# Patient Record
Sex: Male | Born: 1945
Health system: Southern US, Community
[De-identification: ages and names within clinical notes are randomized; demographics above are authoritative.]

## PROBLEM LIST (undated history)

## (undated) DIAGNOSIS — R350 Frequency of micturition: Secondary | ICD-10-CM

## (undated) DIAGNOSIS — I4510 Unspecified right bundle-branch block: Secondary | ICD-10-CM

## (undated) DIAGNOSIS — Z9189 Other specified personal risk factors, not elsewhere classified: Secondary | ICD-10-CM

## (undated) DIAGNOSIS — N35919 Unspecified urethral stricture, male, unspecified site: Secondary | ICD-10-CM

## (undated) DIAGNOSIS — I10 Essential (primary) hypertension: Secondary | ICD-10-CM

## (undated) DIAGNOSIS — E785 Hyperlipidemia, unspecified: Secondary | ICD-10-CM

## (undated) DIAGNOSIS — Z8546 Personal history of malignant neoplasm of prostate: Secondary | ICD-10-CM

## (undated) DIAGNOSIS — R3915 Urgency of urination: Secondary | ICD-10-CM

## (undated) DIAGNOSIS — K219 Gastro-esophageal reflux disease without esophagitis: Secondary | ICD-10-CM

## (undated) HISTORY — PX: RETINAL DETACHMENT SURGERY: SHX105

## (undated) HISTORY — DX: Essential (primary) hypertension: I10

## (undated) HISTORY — DX: Hyperlipidemia, unspecified: E78.5

## (undated) HISTORY — PX: COLONOSCOPY: SHX174

---

## 2000-02-11 ENCOUNTER — Ambulatory Visit (HOSPITAL_COMMUNITY): Admission: RE | Admit: 2000-02-11 | Discharge: 2000-02-11 | Payer: Self-pay | Admitting: Gastroenterology

## 2000-08-23 ENCOUNTER — Encounter: Payer: Self-pay | Admitting: Family Medicine

## 2000-08-23 ENCOUNTER — Encounter: Admission: RE | Admit: 2000-08-23 | Discharge: 2000-08-23 | Payer: Self-pay | Admitting: Family Medicine

## 2004-02-22 ENCOUNTER — Ambulatory Visit (HOSPITAL_COMMUNITY): Admission: RE | Admit: 2004-02-22 | Discharge: 2004-02-23 | Payer: Self-pay | Admitting: Ophthalmology

## 2004-02-22 HISTORY — PX: PARS PLANA VITRECTOMY W/ ENDOPHOTOCOAGULATION: SHX2168

## 2004-03-09 HISTORY — PX: TOTAL SHOULDER REPLACEMENT: SUR1217

## 2005-01-12 ENCOUNTER — Encounter: Admission: RE | Admit: 2005-01-12 | Discharge: 2005-01-12 | Payer: Self-pay | Admitting: Family Medicine

## 2008-04-20 ENCOUNTER — Encounter: Admission: RE | Admit: 2008-04-20 | Discharge: 2008-04-20 | Payer: Self-pay | Admitting: Family Medicine

## 2008-06-06 ENCOUNTER — Encounter: Payer: Self-pay | Admitting: Emergency Medicine

## 2008-07-16 ENCOUNTER — Encounter: Payer: Self-pay | Admitting: Emergency Medicine

## 2008-09-03 DIAGNOSIS — I10 Essential (primary) hypertension: Secondary | ICD-10-CM | POA: Insufficient documentation

## 2008-09-03 DIAGNOSIS — K219 Gastro-esophageal reflux disease without esophagitis: Secondary | ICD-10-CM | POA: Insufficient documentation

## 2008-09-03 DIAGNOSIS — E7800 Pure hypercholesterolemia, unspecified: Secondary | ICD-10-CM | POA: Insufficient documentation

## 2008-09-04 ENCOUNTER — Ambulatory Visit: Payer: Self-pay | Admitting: Emergency Medicine

## 2008-09-04 DIAGNOSIS — R05 Cough: Secondary | ICD-10-CM

## 2008-09-04 DIAGNOSIS — R059 Cough, unspecified: Secondary | ICD-10-CM | POA: Insufficient documentation

## 2008-10-16 ENCOUNTER — Ambulatory Visit: Payer: Self-pay | Admitting: Emergency Medicine

## 2009-01-15 ENCOUNTER — Encounter: Payer: Self-pay | Admitting: Emergency Medicine

## 2010-07-25 NOTE — Op Note (Signed)
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   OPERATION:  Pars plana vitrectomy using vitreous infusion suction cutter,  endolaser photocoagulation, indirect laser photocoagulation.   SURGEON:  Guadelupe Sabin, M.D.   ASSISTANT:  Nurse   ANESTHESIA:  Local 4% Xylocaine, 0.75% Marcaine, retrobulbar block, topical  Tetracaine, and topical Xylocaine.  Anesthesia standby required in this  patient.   OPERATIVE PROCEDURE:  After the patient was prepped and draped, the lid  speculum was inserted in the right eye.  The eye was turned downward and a  superior rectus traction suture placed.  A peritomy was performed adjacent  to the limbus from the 2 to 4 o'clock position superiorly.  The  subconjunctival tissue was cleaned and three sclerotomy sites prepared with  the MVR blade 3.5 mm from the limbus using the 20 gauge MVR knife.  The 4 mm  vitreous infusion terminal was secured in place with a 5-0 mattress Dacron  suture at the 4 o'clock position.  The fiberoptic light pipe was inserted at  the 2 o'clock position and a handpiece vitreous infusion suction cutter at  the 10 o'clock position.  Slow vitreous infusion suction cutting were begun  from an anterior to posterior direction toward the retina.  Dense vitreous  hemorrhage was incurred, new and old.  As the vitreous cleared, the  underlying retina could  be seen with a pool of blood on the retinal surface.  Using the silicone tip aspirator, this blood was slowly aspirated.  Indirect  ophthalmoscopy was then performed revealing the old retinal tear site at the  11 o'clock position close to the ora serrata.  The indirect laser  photocoagulator was then assembled and applications applied surrounding the  retinal tear.  The vitreous instruments were reinserted and the vitreous  cleared once again.  An abnormal vascular area was noted along the superior  temporal retinal blood vessel with possible AV communication consistent with  Eales disease.  Endolaser photocoagulation was used to surround this area of  vascular abnormality and possible retinal neovascularization.  A total  number of 423 applications were made with the indirect and with the  endolaser.  The vitreous instruments were then withdrawn after the fundus  had been inspected revealing no peripheral retinal tears or detachment.  The  sclerotomy sites were closed with 7-0 interrupted Vicryl sutures and the  conjunctiva brought forward and closed with a running 7-0 Vicryl suture.  Depo-Garamycin and Dexamethasone were injected in the sub-tenon space  inferiorly.  Atropine and Maxitrol  ointment were instilled in the  conjunctival cul-de-sac.  A  light patch and protective shield were applied.  Duration of the procedure 1  1/2 hours.  The patient tolerated the procedure well in general and left the  operating room for the recovery room and subsequently to the 23 hour  observation unit.       HNJ/MEDQ  D:  02/22/2004  T:  02/22/2004  Job:  [id number]

## 2010-07-25 NOTE — H&P (Signed)
NAME:  Frederick Miller, Frederick Miller               ACCOUNT NO.:  [id number]   MEDICAL RECORD NO.:  [id number]          PATIENT TYPE:  OIB   LOCATION:  2550                         FACILITY:  [facility]   PHYSICIAN:  Guadelupe Sabin, M.D.DATE OF BIRTH:  08-12-1945   DATE OF ADMISSION:  02/22/2004  DATE OF DISCHARGE:                                HISTORY & PHYSICAL   HISTORY OF PRESENT ILLNESS:  This was a planned outpatient surgical  admission of this 65 year old, white male admitted for posterior vitrectomy  surgery for a chronic and recurrent vitreous hemorrhage in right eye.  This  patient noted the sudden onset of blurred vision in his right eye.  He was  seen by Dr. Garlan Fillers who noted vitreous hemorrhage and possible retinal  tear of the right eye.  The patient was referred to my office and seen first  on September 24, 2003, after a 3 month duration.  It was felt that there was a  suspicious retinal tear area at the 11 o'clock position and this was  surrounded with laser photocoagulation on September 24, 2003.  The patient did  well following this initially with clearing of the vitreous spontaneously  and good laser reaction around the retinal tear.  Secondary vitreous  hemorrhage, however, has occurred on at least two occasions and his vision  now has been reduced by the vitreous hemorrhage to 20/200 in right eye,  compared to the left eye 20/25 without glasses.  The patient was given oral  discussion and printed information concerning vitrectomy surgery.  He signed  an informed consent and arrangements are made for his outpatient admission  at this time.   PAST MEDICAL HISTORY:  The patient is in good general health under the care  of Dr. Arvilla Market.  Dr. Arvilla Market notes the stabilized hypertension and he is  being treated with lisinopril and fish oil.  He is felt by Dr. Arvilla Market to  be a minimal risk for the proposed surgery under local anesthesia.   REVIEW OF SYSTEMS:  No cardiorespiratory  complaints.   PHYSICAL EXAMINATION:  VITAL SIGNS:  Blood pressure 133/89, pulse 69,  respirations 18, temperature 97.9.  GENERAL:  The patient is a pleasant, well-nourished, well-developed, 58-year-  old, white male in no acute distress.  HEENT:  Visual acuity as noted above.  Slit lamp examination normal.  Fundus  examination with dense vitreous obscuring retinal details.  The previously  treated laser photocoagulation site cannot be visualized well due to the  vitreous hemorrhage.  CHEST:  Lungs clear to auscultation and percussion.  HEART:  Normal sinus rhythm with no cardiomegaly or murmurs.  ABDOMEN:  Negative.  EXTREMITIES:  Negative.   ASSESSMENT:  1.  Chronic and recurrent vitreous hemorrhage.  2.  Old retinal tear, right eye.   PLAN:  A pars plana vitrectomy with endolaser photocoagulation.       HNJ/MEDQ  D:  02/22/2004  T:  02/22/2004  Job:  [id number]   cc:   Vincenza Hews, M.D.  [address]  [place]  [place] [address]  Fax: [phone]   Donia Guiles,  M.D.  [address] [place]  [place] [address]  Fax: [phone]

## 2010-07-25 NOTE — Procedures (Signed)
[facility]  Patient:    Frederick Miller, Frederick Miller                      MRN: [id number] Proc. Date: 02/11/00 Adm. Date:  [id number] Attending:  Orland Mustard CC:         Desma Maxim, M.D.   Procedure Report  PROCEDURE:  Colonoscopy.  MEDICATIONS:  Fentanyl 75 mcg, Versed 6 mg IV.  INDICATIONS:  Hemoccult positive stool in a 65 year old gentleman.  DESCRIPTION OF PROCEDURE:  The patient had the procedure explained to him in the office and consent obtained.  The adult Olympus video colonoscope was used.  The scope was inserted following exam and advanced under direct visualization.  The prep was excellent.  I was able to reach the cecum with minimal problems with a small amount of abdominal pressure.  The ileocecal valve and appendiceal orifice were identified.  The scope was withdrawn.  The cecum and ascending colon, hepatic flexure, transverse colon, splenic flexure and descending colon seen well and no polyps, diverticular disease, tumors, or any other lesions of significance were seen.  Internal hemorrhoids were seen in the rectum upon removal of the scope.  The scope was withdrawn, and the patient tolerated the procedure well and was maintained on low-flow oxygen and pulse oximeter throughout the procedure.  ASSESSMENT:  Hemoccult positive stool, probably secondary to hemorrhoids.  PLAN:  Will have the patient follow up with Dr. Arvilla Market, yearly Hemoccults with sigmoidoscopy every five years. DD:  02/11/00 TD:  02/11/00 Job: [id number]

## 2010-10-21 ENCOUNTER — Ambulatory Visit
Admission: RE | Admit: 2010-10-21 | Discharge: 2010-10-21 | Disposition: A | Discharge status: Home / Self Care | Payer: Medicare Other | Source: Ambulatory Visit | Attending: Radiation Oncology | Admitting: Radiation Oncology

## 2010-10-21 DIAGNOSIS — C61 Malignant neoplasm of prostate: Secondary | ICD-10-CM | POA: Insufficient documentation

## 2010-10-21 DIAGNOSIS — Z8042 Family history of malignant neoplasm of prostate: Secondary | ICD-10-CM | POA: Insufficient documentation

## 2010-10-21 DIAGNOSIS — E7800 Pure hypercholesterolemia, unspecified: Secondary | ICD-10-CM | POA: Insufficient documentation

## 2010-10-21 DIAGNOSIS — Z79899 Other long term (current) drug therapy: Secondary | ICD-10-CM | POA: Insufficient documentation

## 2010-10-21 DIAGNOSIS — I10 Essential (primary) hypertension: Secondary | ICD-10-CM | POA: Insufficient documentation

## 2010-11-03 ENCOUNTER — Encounter (HOSPITAL_BASED_OUTPATIENT_CLINIC_OR_DEPARTMENT_OTHER)
Admission: RE | Admit: 2010-11-03 | Discharge: 2010-11-03 | Disposition: A | Discharge status: Home / Self Care | Payer: Medicare Other | Source: Ambulatory Visit | Attending: Urology | Admitting: Urology

## 2010-11-03 ENCOUNTER — Other Ambulatory Visit: Payer: Self-pay | Admitting: Urology

## 2010-11-03 ENCOUNTER — Ambulatory Visit
Admission: RE | Admit: 2010-11-03 | Discharge: 2010-11-03 | Disposition: A | Discharge status: Home / Self Care | Payer: Medicare Other | Source: Ambulatory Visit | Attending: Radiation Oncology | Admitting: Radiation Oncology

## 2010-11-03 ENCOUNTER — Other Ambulatory Visit: Payer: Self-pay | Admitting: Radiation Oncology

## 2010-11-03 DIAGNOSIS — C61 Malignant neoplasm of prostate: Secondary | ICD-10-CM

## 2010-11-03 DIAGNOSIS — Z01811 Encounter for preprocedural respiratory examination: Secondary | ICD-10-CM

## 2010-11-03 DIAGNOSIS — Z01810 Encounter for preprocedural cardiovascular examination: Secondary | ICD-10-CM | POA: Insufficient documentation

## 2010-12-04 LAB — CBC
HCT: 42.6 % (ref 39.0–52.0)
Hemoglobin: 14.6 g/dL (ref 13.0–17.0)
MCV: 87.8 fL (ref 78.0–100.0)
RBC: 4.85 MIL/uL (ref 4.22–5.81)
WBC: 6.8 10*3/uL (ref 4.0–10.5)

## 2010-12-04 LAB — COMPREHENSIVE METABOLIC PANEL
Alkaline Phosphatase: 71 U/L (ref 39–117)
BUN: 13 mg/dL (ref 6–23)
CO2: 29 mEq/L (ref 19–32)
Chloride: 94 mEq/L — ABNORMAL LOW (ref 96–112)
Creatinine, Ser: 0.83 mg/dL (ref 0.50–1.35)
GFR calc non Af Amer: >60 mL/min (ref >60)
Glucose, Bld: 92 mg/dL (ref 70–99)
Total Bilirubin: 0.9 mg/dL (ref 0.3–1.2)

## 2010-12-04 LAB — PROTIME-INR
INR: 1.08 (ref 0.00–1.49)
Prothrombin Time: 14.2 seconds (ref 11.6–15.2)

## 2010-12-04 LAB — APTT: aPTT: 38 seconds — ABNORMAL HIGH (ref 24–37)

## 2010-12-11 ENCOUNTER — Ambulatory Visit (HOSPITAL_BASED_OUTPATIENT_CLINIC_OR_DEPARTMENT_OTHER)
Admission: RE | Admit: 2010-12-11 | Discharge: 2010-12-11 | Disposition: A | Discharge status: Home / Self Care | Payer: Medicare Other | Source: Ambulatory Visit | Attending: Urology | Admitting: Urology

## 2010-12-11 ENCOUNTER — Ambulatory Visit (HOSPITAL_COMMUNITY): Payer: Medicare Other | Attending: Urology

## 2010-12-11 DIAGNOSIS — C61 Malignant neoplasm of prostate: Secondary | ICD-10-CM | POA: Insufficient documentation

## 2010-12-11 DIAGNOSIS — Z01812 Encounter for preprocedural laboratory examination: Secondary | ICD-10-CM | POA: Insufficient documentation

## 2010-12-11 DIAGNOSIS — I10 Essential (primary) hypertension: Secondary | ICD-10-CM | POA: Insufficient documentation

## 2010-12-11 DIAGNOSIS — K219 Gastro-esophageal reflux disease without esophagitis: Secondary | ICD-10-CM | POA: Insufficient documentation

## 2010-12-11 HISTORY — PX: OTHER SURGICAL HISTORY: SHX169

## 2010-12-28 NOTE — Op Note (Addendum)
  NAME:  Frederick Miller, Frederick Miller               ACCOUNT NO.:  [id number]  MEDICAL RECORD NO.:  [id number]  LOCATION:                                 FACILITY:  PHYSICIAN:  Jona Erkkila I. Patsi Sears, M.D. DATE OF BIRTH:  DATE OF PROCEDURE: DATE OF DISCHARGE:                              OPERATIVE REPORT   PREOPERATIVE DIAGNOSIS:  T1c adenocarcinoma of the prostate.  POSTOPERATIVE DIAGNOSIS:  T1c adenocarcinoma of the prostate.  OPERATION:  I-125 seed implantation.  SURGEON:  Breion Novacek I. Patsi Sears, M.D.  ANESTHESIA:  General LMA.  PREPARATION:  After appropriate preanesthesia, the patient was brought to the operating room and placed on the operating room table in the dorsal supine position where general LMA anesthesia was introduced.  He was then replaced in the dorsal lithotomy position where the pubis was prepped with Betadine solution and draped in the usual fashion.  REVIEW OF HISTORY:  This 65 year old male has significant increase in his PSA, although the total was 2.65.  The patient has a brother who is status post radical retropubic prostatectomy, combined with radiation therapy, and another brother with a radical retropubic prostatectomy in 1999 for carcinoma of the prostate.  His biopsy showed Gleason 3+3 prostate cancer in 5% of the right lateral apex biopsies, in the right central medial biopsies, in the left lateral mid biopsies, with precancerous lesion in the right lateral apex biopsies right mid-base biopsies, right mid-medial biopsies, and right apex medial biopsies, and in the left apex medial biopsies.  After full discussion of treatment, the patient was seen a second opinion by Dr. Dayton Scrape, and has decided to have I-125 seed implantation for treatment.  The patient voids well, but does have nocturia x5 with some frequency.  He does drink 2 beers before bedtime, however.  His PSA in 2010 was 0.95.  IPSS is 7/7.  PROCEDURE IN DETAIL:  With the patient dorsal lithotomy  position, a transrectal ultrasound was accomplished, and the template for I-125 seed implantation was reviewed.  The patient had previously had preoperative evaluation and radiation therapy.  The patient underwent implantation of 79, I-125 seeds, with a total radiation dose of 145 gray, delivered in 27 needles.  Cystoscopy at the end of procedure revealed some blood clot within the bladder, and this fluid was irrigated free from the bladder after Foley catheter was placed, and was evaluated and found to have no radiation.  No seeds were seen in the prostatic urethra either.  The patient was given IV Toradol, and was awakened, taken to the recovery room in good condition.     Hady Niemczyk I. Patsi Sears, M.D.     SIT/MEDQ  D:  12/11/2010  T:  12/12/2010  Job:  [id number]  cc:   Maryln Gottron, M.D. Fax: [phone]  Electronically Signed by Jethro Bolus M.D. on 12/28/2010 10:55:52 AM

## 2011-01-13 ENCOUNTER — Encounter: Payer: Self-pay | Admitting: Radiation Oncology

## 2011-01-13 NOTE — Progress Notes (Signed)
The patient underwent post-prostate seed implant 3-D simulation to assess the quality of his implant. His intraoperative prostate volume was 36.0 cc by ultrasound and 32.0 cc by CT postoperatively. Dose volume histograms were obtained for the prostate and rectum. His prostate D 90 is 118.23% and his V100 96.1%, both excellent. 0.67 cc of rectum received the prescribed dose of 14,500 cGy.  In summary, the patient has excellent post implant dosimetry with a low-risk for late rectal toxicity.

## 2012-01-19 ENCOUNTER — Other Ambulatory Visit: Payer: Self-pay | Admitting: Family Medicine

## 2012-01-19 ENCOUNTER — Ambulatory Visit
Admission: RE | Admit: 2012-01-19 | Discharge: 2012-01-19 | Disposition: A | Discharge status: Home / Self Care | Payer: Medicare Other | Source: Ambulatory Visit | Attending: Family Medicine | Admitting: Family Medicine

## 2012-01-19 DIAGNOSIS — M5410 Radiculopathy, site unspecified: Secondary | ICD-10-CM

## 2013-07-27 IMAGING — CR DG CHEST 2V
2 series · 2 of 2 positions shown · non-contrast
Comparison: Chest x-ray 04/20/2008.

CLINICAL DATA: Preop chest x-ray for prostate cancer surgery.

CHEST - 2 VIEW

[view not recorded (1 of 2)]
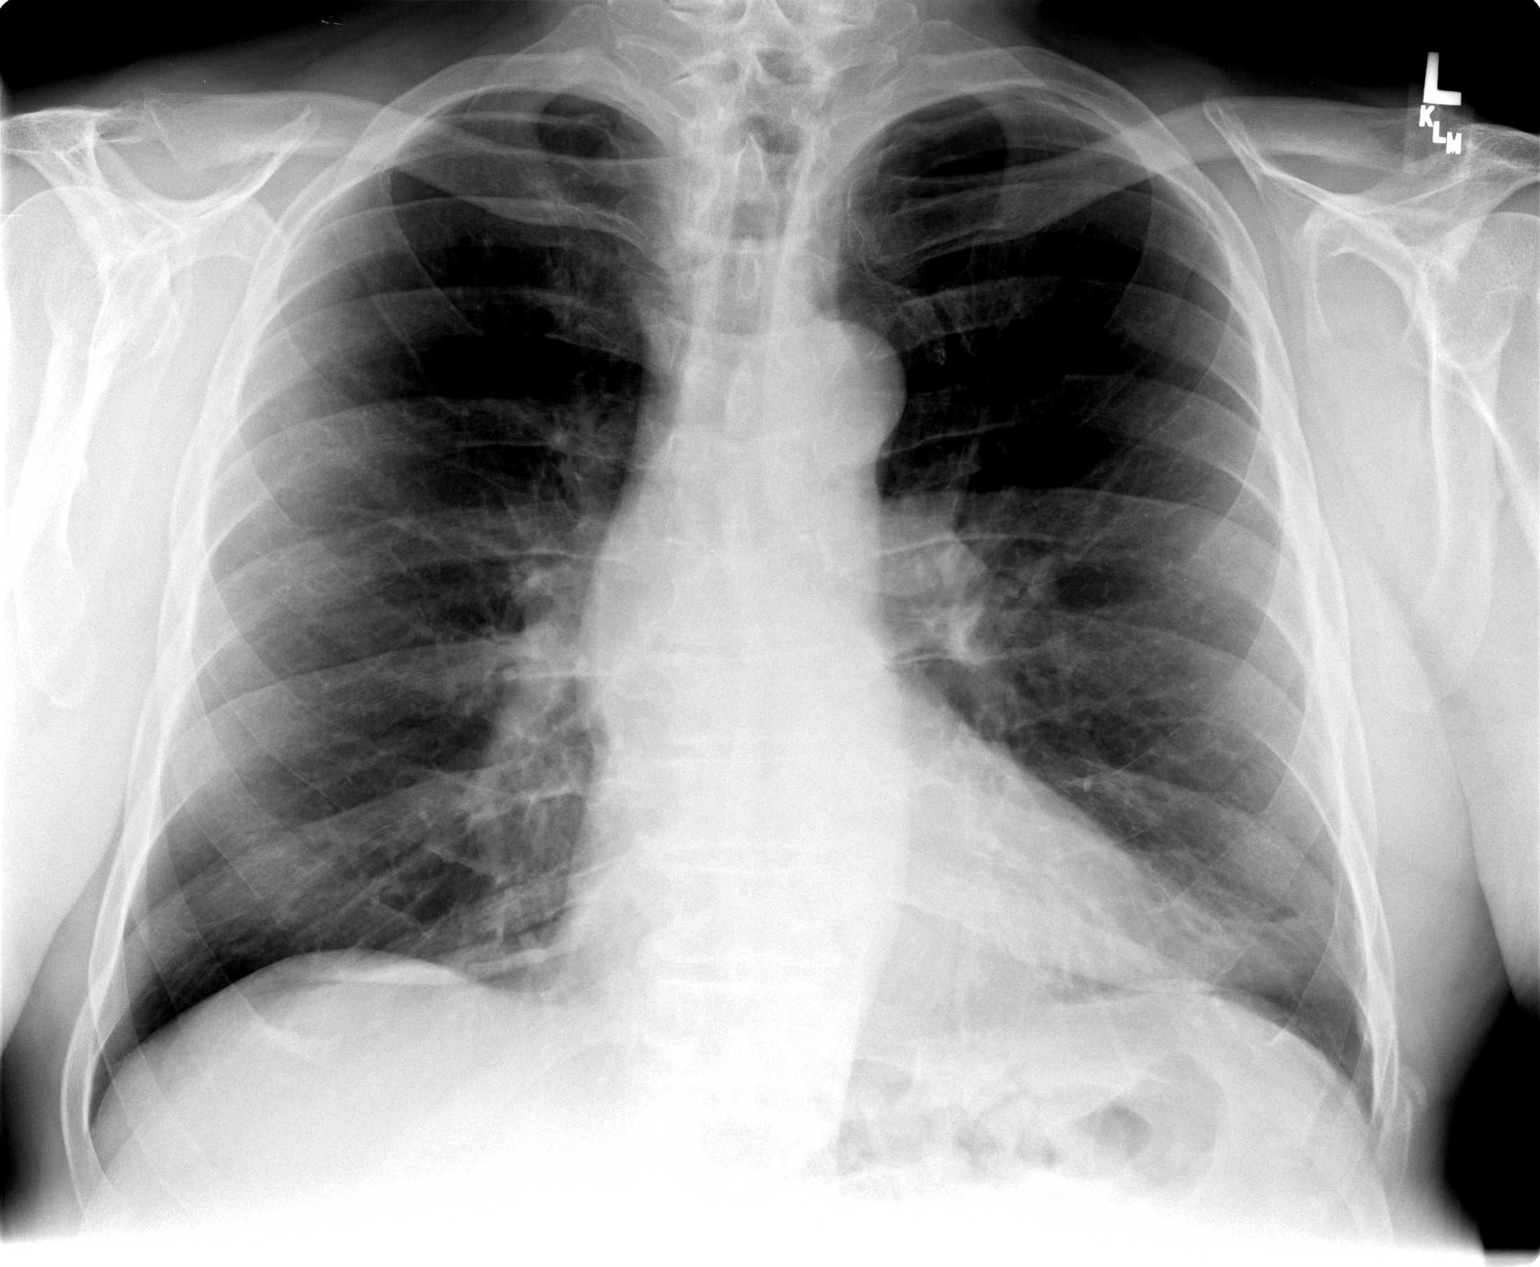

[view not recorded (2 of 2)]
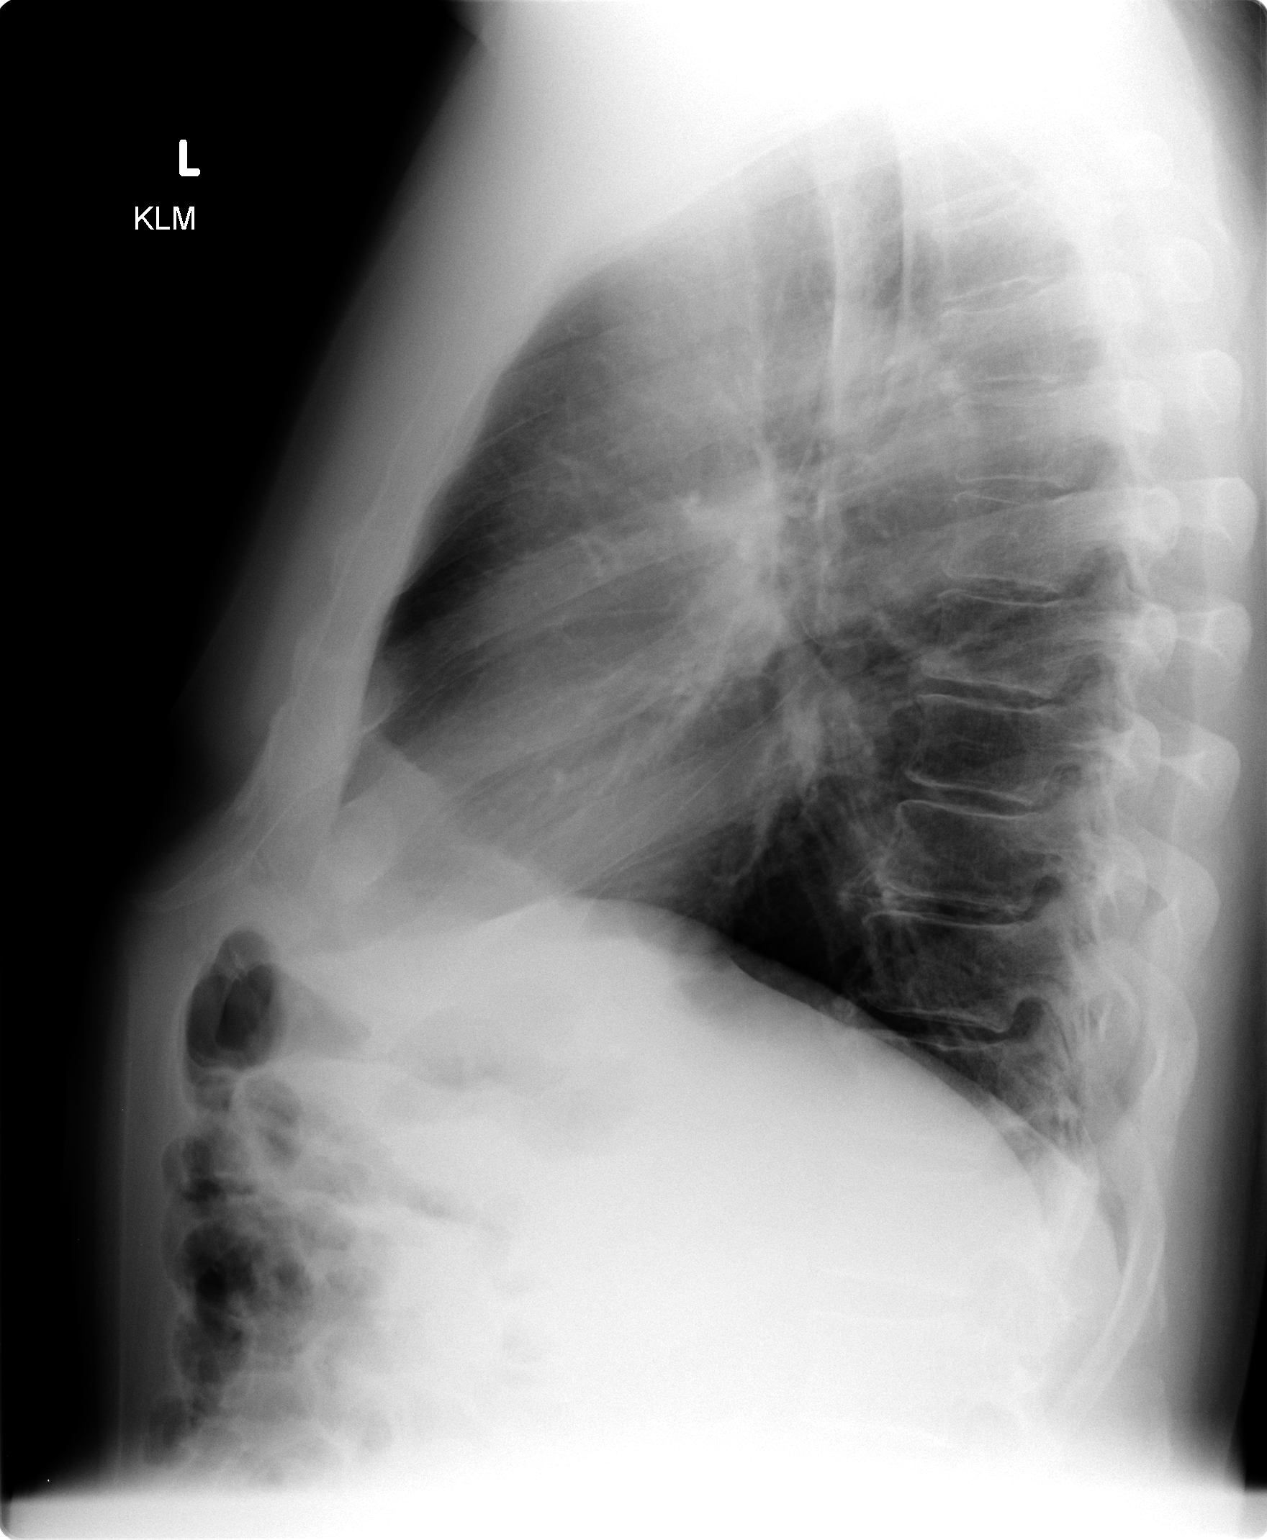

[2 of 2 positions shown; findings below may reference images not displayed]

FINDINGS: The cardiac silhouette, mediastinal and hilar contours
are within normal limits and stable.  The lungs are clear.  The
bony thorax is intact.
IMPRESSION: No acute cardiopulmonary findings.  No change since prior
examination.

## 2014-01-14 ENCOUNTER — Encounter: Payer: Self-pay | Admitting: *Deleted

## 2014-01-16 ENCOUNTER — Other Ambulatory Visit: Payer: Self-pay | Admitting: Urology

## 2014-01-23 ENCOUNTER — Encounter (HOSPITAL_BASED_OUTPATIENT_CLINIC_OR_DEPARTMENT_OTHER): Payer: Self-pay | Admitting: *Deleted

## 2014-01-25 ENCOUNTER — Encounter (HOSPITAL_BASED_OUTPATIENT_CLINIC_OR_DEPARTMENT_OTHER): Payer: Self-pay | Admitting: *Deleted

## 2014-01-25 NOTE — Progress Notes (Signed)
   01/25/14 1119  OBSTRUCTIVE SLEEP APNEA  Have you ever been diagnosed with sleep apnea through a sleep study? No  Do you snore loudly (loud enough to be heard through closed doors)?  0  Do you often feel tired, fatigued, or sleepy during the daytime? 0  Has anyone observed you stop breathing during your sleep? 0  Do you have, or are you being treated for high blood pressure? 1  BMI more than 35 kg/m2? 0  Age over 68 years old? 1  Neck circumference greater than 40 cm/16 inches? 1  Gender: 1  Obstructive Sleep Apnea Score 4  Score 4 or greater  Results sent to PCP

## 2014-01-25 NOTE — Progress Notes (Signed)
NPO AFTER MN. ARRIVE AT 1000. NEEDS ISTAT AND EKG. WILL TAKE NORVASC AND NEXIUM AM DOS W/ SIPS OF WATER.

## 2014-01-29 ENCOUNTER — Ambulatory Visit (HOSPITAL_BASED_OUTPATIENT_CLINIC_OR_DEPARTMENT_OTHER)
Admission: RE | Admit: 2014-01-29 | Discharge: 2014-01-29 | Disposition: A | Discharge status: Home / Self Care | Payer: Medicare Other | Source: Ambulatory Visit | Attending: Urology | Admitting: Urology

## 2014-01-29 ENCOUNTER — Ambulatory Visit (HOSPITAL_BASED_OUTPATIENT_CLINIC_OR_DEPARTMENT_OTHER): Payer: Medicare Other | Admitting: Anesthesiology

## 2014-01-29 ENCOUNTER — Encounter (HOSPITAL_BASED_OUTPATIENT_CLINIC_OR_DEPARTMENT_OTHER): Payer: Self-pay | Admitting: *Deleted

## 2014-01-29 ENCOUNTER — Ambulatory Visit (HOSPITAL_COMMUNITY): Payer: Medicare Other

## 2014-01-29 ENCOUNTER — Other Ambulatory Visit: Payer: Self-pay

## 2014-01-29 ENCOUNTER — Encounter (HOSPITAL_BASED_OUTPATIENT_CLINIC_OR_DEPARTMENT_OTHER)
Admission: RE | Disposition: A | Discharge status: Home / Self Care | Payer: Self-pay | Source: Ambulatory Visit | Attending: Urology

## 2014-01-29 DIAGNOSIS — R351 Nocturia: Secondary | ICD-10-CM | POA: Insufficient documentation

## 2014-01-29 DIAGNOSIS — Z8042 Family history of malignant neoplasm of prostate: Secondary | ICD-10-CM | POA: Diagnosis not present

## 2014-01-29 DIAGNOSIS — Z79899 Other long term (current) drug therapy: Secondary | ICD-10-CM | POA: Diagnosis not present

## 2014-01-29 DIAGNOSIS — K219 Gastro-esophageal reflux disease without esophagitis: Secondary | ICD-10-CM | POA: Diagnosis not present

## 2014-01-29 DIAGNOSIS — N320 Bladder-neck obstruction: Secondary | ICD-10-CM | POA: Diagnosis not present

## 2014-01-29 DIAGNOSIS — N359 Urethral stricture, unspecified: Secondary | ICD-10-CM | POA: Insufficient documentation

## 2014-01-29 DIAGNOSIS — I10 Essential (primary) hypertension: Secondary | ICD-10-CM | POA: Insufficient documentation

## 2014-01-29 DIAGNOSIS — R3912 Poor urinary stream: Secondary | ICD-10-CM | POA: Diagnosis not present

## 2014-01-29 DIAGNOSIS — N529 Male erectile dysfunction, unspecified: Secondary | ICD-10-CM | POA: Insufficient documentation

## 2014-01-29 DIAGNOSIS — C61 Malignant neoplasm of prostate: Secondary | ICD-10-CM | POA: Diagnosis not present

## 2014-01-29 DIAGNOSIS — R350 Frequency of micturition: Secondary | ICD-10-CM | POA: Diagnosis not present

## 2014-01-29 DIAGNOSIS — Z85828 Personal history of other malignant neoplasm of skin: Secondary | ICD-10-CM | POA: Diagnosis not present

## 2014-01-29 DIAGNOSIS — N99112 Postprocedural membranous urethral stricture: Secondary | ICD-10-CM

## 2014-01-29 HISTORY — DX: Personal history of malignant neoplasm of prostate: Z85.46

## 2014-01-29 HISTORY — DX: Urgency of urination: R39.15

## 2014-01-29 HISTORY — DX: Frequency of micturition: R35.0

## 2014-01-29 HISTORY — DX: Other specified personal risk factors, not elsewhere classified: Z91.89

## 2014-01-29 HISTORY — PX: BALLOON DILATION: SHX5330

## 2014-01-29 HISTORY — DX: Unspecified urethral stricture, male, unspecified site: N35.919

## 2014-01-29 HISTORY — DX: Unspecified right bundle-branch block: I45.10

## 2014-01-29 HISTORY — DX: Gastro-esophageal reflux disease without esophagitis: K21.9

## 2014-01-29 LAB — POCT I-STAT 4, (NA,K, GLUC, HGB,HCT)
Glucose, Bld: 101 mg/dL — ABNORMAL HIGH (ref 70–99)
HEMATOCRIT: 43 % (ref 39.0–52.0)
HEMOGLOBIN: 14.6 g/dL (ref 13.0–17.0)
Potassium: 4 mEq/L (ref 3.7–5.3)
SODIUM: 136 meq/L — AB (ref 137–147)

## 2014-01-29 SURGERY — BALLOON DILATION
Anesthesia: General | Site: Ureter

## 2014-01-29 MED ORDER — IOHEXOL 350 MG/ML SOLN
INTRAVENOUS | Status: DC | PRN
  Administered 2014-01-29: 20 mL

## 2014-01-29 MED ORDER — LACTATED RINGERS IV SOLN
INTRAVENOUS | Status: DC | PRN
  Administered 2014-01-29 (×2): via INTRAVENOUS

## 2014-01-29 MED ORDER — PROPOFOL 10 MG/ML IV BOLUS
INTRAVENOUS | Status: DC | PRN
Start: 2014-01-29 — End: 2014-01-29
  Administered 2014-01-29: 50 mg via INTRAVENOUS
  Administered 2014-01-29: 100 mg via INTRAVENOUS
  Administered 2014-01-29: 250 mg via INTRAVENOUS

## 2014-01-29 MED ORDER — LACTATED RINGERS IV SOLN
INTRAVENOUS | Status: DC
  Administered 2014-01-29: 11:00:00 via INTRAVENOUS
  Filled 2014-01-29: qty 1000

## 2014-01-29 MED ORDER — STERILE WATER FOR IRRIGATION IR SOLN
Status: DC | PRN
Start: 2014-01-29 — End: 2014-01-29
  Administered 2014-01-29: 20 mL

## 2014-01-29 MED ORDER — PHENAZOPYRIDINE HCL 200 MG PO TABS: 200.0000 mg | ORAL_TABLET | Freq: Three times a day (TID) | ORAL | Status: AC | PRN

## 2014-01-29 MED ORDER — SODIUM CHLORIDE 0.9 % IR SOLN
Status: DC | PRN
  Administered 2014-01-29: 3000 mL via INTRAVESICAL

## 2014-01-29 MED ORDER — FENTANYL CITRATE 0.05 MG/ML IJ SOLN
INTRAMUSCULAR | Status: AC
  Filled 2014-01-29: qty 6

## 2014-01-29 MED ORDER — FENTANYL CITRATE 0.05 MG/ML IJ SOLN
25.0000 ug | INTRAMUSCULAR | Status: DC | PRN
  Filled 2014-01-29: qty 1

## 2014-01-29 MED ORDER — DEXAMETHASONE SODIUM PHOSPHATE 10 MG/ML IJ SOLN
INTRAMUSCULAR | Status: DC | PRN
  Administered 2014-01-29: 10 mg via INTRAVENOUS

## 2014-01-29 MED ORDER — ACETAMINOPHEN 10 MG/ML IV SOLN
INTRAVENOUS | Status: DC | PRN
  Administered 2014-01-29: 1000 mg via INTRAVENOUS

## 2014-01-29 MED ORDER — KETOROLAC TROMETHAMINE 30 MG/ML IJ SOLN
INTRAMUSCULAR | Status: DC | PRN
  Administered 2014-01-29: 30 mg via INTRAVENOUS

## 2014-01-29 MED ORDER — PHENAZOPYRIDINE HCL 100 MG PO TABS
ORAL_TABLET | ORAL | Status: AC
  Filled 2014-01-29: qty 2

## 2014-01-29 MED ORDER — CEFAZOLIN SODIUM-DEXTROSE 2-3 GM-% IV SOLR
INTRAVENOUS | Status: AC
  Filled 2014-01-29: qty 50

## 2014-01-29 MED ORDER — ONDANSETRON HCL 4 MG/2ML IJ SOLN
INTRAMUSCULAR | Status: DC | PRN
  Administered 2014-01-29: 4 mg via INTRAVENOUS

## 2014-01-29 MED ORDER — MIDAZOLAM HCL 2 MG/2ML IJ SOLN
INTRAMUSCULAR | Status: AC
  Filled 2014-01-29: qty 2

## 2014-01-29 MED ORDER — MIDAZOLAM HCL 5 MG/5ML IJ SOLN
INTRAMUSCULAR | Status: DC | PRN
Start: 2014-01-29 — End: 2014-01-29
  Administered 2014-01-29 (×2): 1 mg via INTRAVENOUS

## 2014-01-29 MED ORDER — CEFAZOLIN SODIUM-DEXTROSE 2-3 GM-% IV SOLR
2.0000 g | INTRAVENOUS | Status: AC
  Administered 2014-01-29: 2 g via INTRAVENOUS
  Filled 2014-01-29: qty 50

## 2014-01-29 MED ORDER — LIDOCAINE HCL (CARDIAC) 20 MG/ML IV SOLN
INTRAVENOUS | Status: DC | PRN
  Administered 2014-01-29: 100 mg via INTRAVENOUS

## 2014-01-29 MED ORDER — PHENAZOPYRIDINE HCL 200 MG PO TABS
200.0000 mg | ORAL_TABLET | Freq: Three times a day (TID) | ORAL | Status: DC | PRN
  Administered 2014-01-29: 200 mg via ORAL
  Filled 2014-01-29: qty 1

## 2014-01-29 MED ORDER — FENTANYL CITRATE 0.05 MG/ML IJ SOLN
INTRAMUSCULAR | Status: DC | PRN
  Administered 2014-01-29 (×6): 25 ug via INTRAVENOUS

## 2014-01-29 MED ORDER — PROMETHAZINE HCL 25 MG/ML IJ SOLN
6.2500 mg | INTRAMUSCULAR | Status: DC | PRN
  Filled 2014-01-29: qty 1

## 2014-01-29 SURGICAL SUPPLY — 30 items
ADAPTER CATH URET PLST 4-6FR (CATHETERS) IMPLANT
ADPR CATH URET STRL DISP 4-6FR (CATHETERS)
BAG DRAIN URO-CYSTO SKYTR STRL (DRAIN) ×2 IMPLANT
BAG DRN UROCATH (DRAIN) ×1
BALLN NEPHROSTOMY (BALLOONS) ×2
BALLOON NEPHROSTOMY (BALLOONS) ×1 IMPLANT
BOOTIES KNEE HIGH SLOAN (MISCELLANEOUS) ×2 IMPLANT
CANISTER SUCT LVC 12 LTR MEDI- (MISCELLANEOUS) ×1 IMPLANT
CATH FOLEY 2W COUNCIL 20FR 5CC (CATHETERS) IMPLANT
CATH INTERMIT  6FR 70CM (CATHETERS) IMPLANT
CATH URET 5FR 28IN CONE TIP (BALLOONS)
CATH URET 5FR 28IN OPEN ENDED (CATHETERS) IMPLANT
CATH URET 5FR 70CM CONE TIP (BALLOONS) IMPLANT
CLOTH BEACON ORANGE TIMEOUT ST (SAFETY) ×2 IMPLANT
DRAPE CAMERA CLOSED 9X96 (DRAPES) ×2 IMPLANT
GLOVE BIO SURGEON STRL SZ7 (GLOVE) ×1 IMPLANT
GLOVE BIO SURGEON STRL SZ7.5 (GLOVE) ×1 IMPLANT
GLOVE INDICATOR 7.5 STRL GRN (GLOVE) ×1 IMPLANT
GOWN STRL REIN XL XLG (GOWN DISPOSABLE) ×1 IMPLANT
GOWN STRL REUS W/ TWL XL LVL3 (GOWN DISPOSABLE) IMPLANT
GOWN STRL REUS W/TWL XL LVL3 (GOWN DISPOSABLE) ×3 IMPLANT
GOWN XL W/COTTON TOWEL STD (GOWNS) IMPLANT
GUIDEWIRE 0.038 PTFE COATED (WIRE) IMPLANT
GUIDEWIRE ANG ZIPWIRE 038X150 (WIRE) IMPLANT
GUIDEWIRE STR DUAL SENSOR (WIRE) ×2 IMPLANT
NS IRRIG 500ML POUR BTL (IV SOLUTION) IMPLANT
PACK CYSTO (CUSTOM PROCEDURE TRAY) ×2 IMPLANT
SYRINGE IRR TOOMEY STRL 70CC (SYRINGE) IMPLANT
WATER STERILE IRR 3000ML UROMA (IV SOLUTION) ×2 IMPLANT
WATER STERILE IRR 500ML POUR (IV SOLUTION) ×1 IMPLANT

## 2014-01-29 NOTE — Transfer of Care (Signed)
Immediate Anesthesia Transfer of Care Note  Patient: Frederick Miller  Procedure(s) Performed: Procedure(s) (LRB): COOK BALLOON DILATION OF PENDULOUS URETERAL STRICTURE (N/A)  Patient Location: PACU  Anesthesia Type: General  Level of Consciousness: awake, sedated, patient cooperative and responds to stimulation  Airway & Oxygen Therapy: Patient Spontanous Breathing and Patient connected to face mask oxygen  Post-op Assessment: Report given to PACU RN, Post -op Vital signs reviewed and stable and Patient moving all extremities  Post vital signs: Reviewed and stable  Complications: No apparent anesthesia complications

## 2014-01-29 NOTE — Interval H&P Note (Signed)
History and Physical Interval Note:  01/29/2014 10:54 AM  Frederick Miller  has presented today for surgery, with the diagnosis of URETHRAL STRICTURE  The various methods of treatment have been discussed with the patient and family. After consideration of risks, benefits and other options for treatment, the patient has consented to  [facility] dilation of pendulous Urethral stricture as a surgical intervention .  The patient's history has been reviewed, patient examined, no change in status, stable for surgery.  I have reviewed the patient's chart and labs.  Questions were answered to the patient's satisfaction.     Carolan Clines I

## 2014-01-29 NOTE — Anesthesia Postprocedure Evaluation (Signed)
  Anesthesia Post-op Note  Patient: Frederick Miller  Procedure(s) Performed: Procedure(s) (LRB): COOK BALLOON DILATION OF PENDULOUS URETERAL STRICTURE (N/A)  Patient Location: PACU  Anesthesia Type: General  Level of Consciousness: awake and alert   Airway and Oxygen Therapy: Patient Spontanous Breathing  Post-op Pain: mild  Post-op Assessment: Post-op Vital signs reviewed, Patient's Cardiovascular Status Stable, Respiratory Function Stable, Patent Airway and No signs of Nausea or vomiting  Last Vitals:  Filed Vitals:   01/29/14 1230  BP: 118/70  Pulse: 64  Temp:   Resp: 15    Post-op Vital Signs: stable   Complications: No apparent anesthesia complications

## 2014-01-29 NOTE — Anesthesia Procedure Notes (Signed)
Procedure Name: LMA Insertion Date/Time: 01/29/2014 11:18 AM Performed by: Justice Rocher Pre-anesthesia Checklist: Patient identified, Emergency Drugs available, Suction available and Patient being monitored Patient Re-evaluated:Patient Re-evaluated prior to inductionOxygen Delivery Method: Circle System Utilized Preoxygenation: Pre-oxygenation with 100% oxygen Intubation Type: IV induction Ventilation: Mask ventilation without difficulty LMA: LMA inserted LMA Size: 5.0 Number of attempts: 1 Airway Equipment and Method: bite block Placement Confirmation: positive ETCO2 Tube secured with: Tape Dental Injury: Teeth and Oropharynx as per pre-operative assessment  Comments: LMA # 4 placed first, difficult to seal changed immediately to # 5, better seal ETCO2+ BBS equal

## 2014-01-29 NOTE — Anesthesia Preprocedure Evaluation (Signed)
Anesthesia Evaluation  Patient identified by MRN, date of birth, ID band Patient awake    Reviewed: Allergy & Precautions, H&P , NPO status , Patient's Chart, lab work & pertinent test results  Airway Mallampati: II  TM Distance: >3 FB Neck ROM: Full   Comment: Spondylosis on cervical spinal xray in 2013 Dental no notable dental hx.    Pulmonary neg pulmonary ROS,  breath sounds clear to auscultation  Pulmonary exam normal       Cardiovascular hypertension, Pt. on medications + dysrhythmias Rhythm:Regular Rate:Normal     Neuro/Psych negative neurological ROS  negative psych ROS   GI/Hepatic Neg liver ROS, GERD-  Medicated,  Endo/Other  negative endocrine ROS  Renal/GU negative Renal ROS  negative genitourinary   Musculoskeletal negative musculoskeletal ROS (+)   Abdominal   Peds negative pediatric ROS (+)  Hematology negative hematology ROS (+)   Anesthesia Other Findings   Reproductive/Obstetrics negative OB ROS                             Anesthesia Physical Anesthesia Plan  ASA: II  Anesthesia Plan: General   Post-op Pain Management:    Induction: Intravenous  Airway Management Planned: LMA  Additional Equipment:   Intra-op Plan:   Post-operative Plan: Extubation in OR  Informed Consent: I have reviewed the patients History and Physical, chart, labs and discussed the procedure including the risks, benefits and alternatives for the proposed anesthesia with the patient or authorized representative who has indicated his/her understanding and acceptance.   Dental advisory given  Plan Discussed with: CRNA  Anesthesia Plan Comments:         Anesthesia Quick Evaluation

## 2014-01-29 NOTE — Discharge Instructions (Signed)
Post Anesthesia Home Care Instructions  Activity: Get plenty of rest for the remainder of the day. A responsible adult should stay with you for 24 hours following the procedure.  For the next 24 hours, DO NOT: -Drive a car -Paediatric nurse -Drink alcoholic beverages -Take any medication unless instructed by your physician -Make any legal decisions or sign important papers.  Meals: Start with liquid foods such as gelatin or soup. Progress to regular foods as tolerated. Avoid greasy, spicy, heavy foods. If nausea and/or vomiting occur, drink only clear liquids until the nausea and/or vomiting subsides. Call your physician if vomiting continues.  Special Instructions/Symptoms: Your throat may feel dry or sore from the anesthesia or the breathing tube placed in your throat during surgery. If this causes discomfort, gargle with warm salt water. The discomfort should disappear within 24 hours.  Urethrotomy Urethrotomy is a surgical procedure to treat a section of the urethra that is too narrow. The urethra is the tube that carries urine from your bladder out through the tip of your penis. Narrowing of the urethra (urethral stricture) makes it hard or painful to pass urine. You may also be at risk for more frequent urinary infections. Scarring from infection or injury are common causes of urethral stricture. During urethrotomy, your surgeon first passes a thin telescope (cystoscope) into your urethra. Then the surgeon uses a cutting instrument (cold knife) or a heat source (hot knife) to remove any urethral strictures.  LET [facility] CARE PROVIDER KNOW ABOUT:  Any allergies you have.  All medicines you are taking, including vitamins, herbs, eye drops, creams, and over-the-counter medicines.  Previous problems you or members of your family have had with the use of anesthetics.  Any blood disorders you have.  Previous surgeries you have had.  Medical conditions you have.  Any pacemaker  or defibrillator. RISKS AND COMPLICATIONS Generally, this is a safe procedure. However, as with any procedure, problems can occur. The most common problem is having another urethral stricture after surgery. Other less common problems include:   Bleeding.  Burning pain.  Infection.  Needing to put a tube (catheter) in your urethra to prevent the stricture from happening again.  Trouble getting an erection (erectile dysfunction). BEFORE THE PROCEDURE  Do not eat or drink anything after midnight on the night before the procedure or as directed by your health care provider.  Ask your health care provider about changing or stopping your regular medicines. This is especially important if you are taking diabetes medicines or blood thinners.  You may need to have a urine test to check for signs of infection.  You may get antibiotic medicines by injection or through an IV access just before the procedure. This is to prevent infection.  Take a shower before coming to the hospital for surgery. PROCEDURE This procedure usually takes about 30 minutes. During the procedure, the following may happen:  You will be given one of the following:  A medicine that makes you go to sleep (general anesthetic).  A medicine injected into your spine that numbs your body below the waist (spinal anesthetic).  The tip of your penis will be cleaned with a germ-killing (antibacterial) solution.  The surgeon will pass the cystoscope into your urethra.  Urethral strictures will be cut with the cold or hot knife.  You will most likely have a catheter inserted through your urethra and into your bladder. This is to drain your urine. AFTER THE PROCEDURE  You will stay in the recovery  area until the anesthesia wears off and your health care provider says you can go home. Follow all instructions carefully.  Take all medicines as directed by your health care provider.  You may get prescriptions for antibiotics  and a pain reliever.  You may have a small bandage on the tip of your penis.  You may be sent home with a catheter in place. Follow your health care provider's instructions on how to care for the catheter at home. You will need to return to have the catheter removed as directed by your health care provider.  You may see some blood leaking around the catheter when you pass urine.  Drink enough fluid to keep your urine clear or pale yellow.  Ask your health care provider when you can go back to your normal activities after the catheter is removed.  Keep all follow-up appointments. Document Released: 02/28/2013 Document Reviewed: 02/28/2013 [facility] Patient Information 2015 [place], [place]. This information is not intended to replace advice given to you by your health care provider. Make sure you discuss any questions you have with your health care provider.

## 2014-01-29 NOTE — Op Note (Signed)
Pre-operative diagnosis : Proximal urethral stricture with history of adenocarcinoma prostate post radiation therapy with I-125 seeds  Postoperative diagnosis: Same   Operation: Cystourethroscopy, Cook balloon dilation of proximal urethral stricture    Surgeon:  S. Gaynelle Arabian, MD  First assistant: None     Anesthesia:  General LMA   Preparation: After appropriate preanesthesia, the patient was brought to the operative room, placed on the operating table in the dorsal supine position where general LMA anesthesia was introduced. He was then replaced in the dorsal lithotomy position where the pubis was prepped with Betadine solution and draped in usual fashion. The history was double checked. The arm band was double checked.   Review history:  Reason For Visit Urgency/frequency/PVR   Active Problems Problems  1. Erectile dysfunction due to arterial insufficiency (N52.01)  Assessed By: Carolan Clines (Urology); Last Assessed: 08 Nov 2012 2. Nocturia (R35.1) 3. Prostate cancer (C61) 4. Weak urinary stream (R39.12)  History of Present Illness   68 YO male returns today for a PVR because of continued urgency/frequency despite taking Rapaflo x 3 weeks. Hx of prostate cancer. S/P I-125 seed implant on 12/11/10. He has had some increased frequency, urgency, intermittent, weak flow, hesitancy, & nocturia x 3 but this has improved.    He had significant increase in his prostate specific antigen, although the total is 2.65, with PSA-2 of 60%. His brother had RRP at [facility] with eventual follow-up radiation therapy, and older brother had RRP in 1999 for CaP.     Biopsy of the prostate show prostatic carcinoma, Gleason 3+3 equal 6 and 5% of the right lateral apex biopsies, in the right central medial biopsies, in the left mid lateral biopsies. He has precancerous lesion (Hg PIN) in the right lateral apex biopsies, the right mid base, mid medial, and apex medial biopsies, and in the left  apex medial biopsies.     Previous PSA's: 06/06/08 was 0.95 and 05/16/07 was 1.04.    Statement of  Likelihood of Success: Excellent. TIME-OUT observed.:  Procedure:  Direct vision cystoscopy revealed proximal urethral stricture. A guidewire was passed through the stricture into the bladder under fluoroscopic control. The wire was coiled in the bladder.  Again, under fluoroscopic control, a Cook balloon dilator was passed across the urethral stricture, and balloon dilated to 16 atm for 3 minutes. The balloon dilator was removed, and cystourethroscopy was accomplished alongside the guidewire. It showed excellent dilation of the stricture. Some clots were identified within the urethra and flushed into the bladder. There were irrigated free from the bladder with a piston syringe. The bladder itself showed no evidence of trabeculation or cellule formation there was no evidence of bladder stone or tumor. The trigone was normal. There was clear reflux in both cortices. The scope was removed and the bladder was left empty. The patient was awakened and taken to recovery room in good condition.

## 2014-01-29 NOTE — H&P (Signed)
Reason For Visit Urgency/frequency/PVR   Active Problems Problems  1. Erectile dysfunction due to arterial insufficiency (N52.01)   Assessed By: Carolan Clines (Urology); Last Assessed: 08 Nov 2012 2. Nocturia (R35.1) 3. Prostate cancer (C61) 4. Weak urinary stream (R39.12)  History of Present Illness    68 YO male returns today for a PVR because of continued urgency/frequency despite taking Rapaflo x 3 weeks. Hx of prostate cancer. S/P I-125 seed implant on 12/11/10. He has had some increased frequency, urgency, intermittent, weak flow, hesitancy, & nocturia x 3 but this has improved.     He had significant increase in his prostate specific antigen, although the total is 2.65, with PSA-2 of 60%. His brother had RRP at [facility] with eventual follow-up radiation therapy, and older brother had RRP in 1999 for CaP.      Biopsy of the prostate show prostatic carcinoma, Gleason 3+3 equal 6 and 5% of the right lateral apex biopsies, in the right central medial biopsies, in the left mid lateral biopsies. He has precancerous lesion (Hg PIN) in the right lateral apex biopsies, the right mid base, mid medial, and apex medial biopsies, and in the left apex medial biopsies.     Previous PSA's: 06/06/08 was 0.95 and 05/16/07 was 1.04.    11/14/13 PSA - 0.24  11/08/12 PSA - 1.11  04/19/12 PSA - 1.00  10/13/11 PSA - 0.46  04/01/11 PSA - 0.85  07/09/10 PSA - 3.56/11%.     Interval HX:   Today states that voiding continues to be significantly bad. LUTS, iwth IPSS= 21, with severe urine frequency, urgency, weak stream, and nocturia. Denies hematuria or dysruia. No unintentional weight loss or bone pain.   Past Medical History Problems  1. History of esophageal reflux (Z87.19) 2. History of hypercholesterolemia (Z86.39) 3. History of hypertension (Z86.79) 4. History of Skin Cancer  Surgical History Problems  1. History of Complete Colonoscopy 2. History of Eye Surgery 3. History of  Shoulder Surgery 4. History of Surgery Prostate Transperineal Placement Of Needles  Current Meds 1. Fish Oil 1000 MG Oral Capsule;  Therapy: (Recorded:11Oct2011) to Recorded 2. Multivital Platinum Oral Tablet;  Therapy: (Recorded:11Oct2011) to Recorded 3. NexIUM 40 MG Oral Capsule Delayed Release;  Therapy: (Recorded:11Oct2011) to Recorded 4. Rapaflo 8 MG Oral Capsule; TAKE 1 CAPSULE DAILY WITH FOOD;  Therapy: 15Sep2015 to (Evaluate:13Mar2016)  Requested for: 15Sep2015; Last  Rx:15Sep2015 Ordered 5. Simvastatin 20 MG Oral Tablet;  Therapy: (Recorded:11Oct2011) to Recorded 6. Staxyn 10 MG Oral Tablet Dispersible; TAKE 10 MG As Directed;  Therapy: [phone] to (Last Rx:19Aug2013)  Requested for: 19Aug2013 Ordered 7. Tribenzor 40-10-25 MG Oral Tablet;  Therapy: (Recorded:11Oct2011) to Recorded 8. Vitamin B Complex CAPS;  Therapy: (Recorded:11Oct2011) to Recorded 9. Vitamin C 500 MG Oral Tablet;  Therapy: (Recorded:11Oct2011) to Recorded  Allergies Medication  1. No Known Drug Allergies  Family History Problems  1. Family history of Death In The Family Father   50; heart 2. Family history of Death In The Family Mother   39; cancer 3. Family history of Family Health Status Children ___ Living Daughters   1 4. Family history of Family Health Status Children ___ Living Sons   1 5. Family history of Prostate Cancer : Brother 6. Family history of Prostate Cancer : Brother  Social History Problems  1. Alcohol Use   4-6 2. Denied: History of Caffeine Use 3. Marital History - Currently Married 4. Never A Smoker 5. Occupation:   restaurants 6. Denied: History of Tobacco Use  Review of Systems Constitutional, eye, otolaryngeal, hematologic/lymphatic, cardiovascular, pulmonary, endocrine, musculoskeletal, gastrointestinal, neurological and psychiatric system(s) were reviewed and pertinent findings if present are noted.  Genitourinary: urinary frequency, feelings of  urinary urgency, nocturia, difficulty starting the urinary stream, weak urinary stream, urinary stream starts and stops and incomplete emptying of bladder.  Integumentary: skin rash/lesion.    Vitals Vital Signs [Data Includes: Last 1 Day]  Recorded: 07Oct2015 09:23AM  Blood Pressure: 117 / 73 Temperature: 97.4 F Heart Rate: 66  Physical Exam Constitutional: Well nourished and well developed . No acute distress.  ENT:. The ears and nose are normal in appearance.  Neck: The appearance of the neck is normal and no neck mass is present.  Pulmonary: No respiratory distress and normal respiratory rhythm and effort.  Cardiovascular:. No peripheral edema.  Abdomen: The abdomen is soft and nontender. No masses are palpated. No CVA tenderness. No hernias are palpable. No hepatosplenomegaly noted.  Rectal: Rectal exam demonstrates normal sphincter tone, no tenderness and no masses. Estimated prostate size is 3+. Normal rectal tone, no rectal masses, prostate is smooth, symmetric and non-tender. The prostate has no nodularity and is not tender. The left seminal vesicle is nonpalpable. The right seminal vesicle is nonpalpable. The perineum is normal on inspection.  Genitourinary: The penis is circumcised. The scrotum is normal in appearance. Examination of the right scrotum demonstrates no mass. Examination of the left scrotum demostrates no mass. The right testis is palpably normal. The left testis is normal.  Lymphatics: The femoral and inguinal nodes are not enlarged or tender.  Skin: Normal skin turgor, no visible rash and no visible skin lesions.  Neuro/Psych:. Mood and affect are appropriate.    Results/Data Urine [Data Includes: Last 1 Day]   07Oct2015  COLOR YELLOW   APPEARANCE CLEAR   SPECIFIC GRAVITY 1.015   pH 6.0   GLUCOSE NEG mg/dL  BILIRUBIN NEG   KETONE NEG mg/dL  BLOOD NEG   PROTEIN NEG mg/dL  UROBILINOGEN 0.2 mg/dL  NITRITE NEG   LEUKOCYTE ESTERASE NEG    PVR: Ultrasound  PVR 46 ml.    Assessment Assessed  1. Prostate cancer (C61) 2. Bladder outlet obstruction (N32.0) 3. Weak urinary stream (R39.12) 4. Increased urinary frequency (R35.0)  Prostate cancer, 3 yrs post I-125 seed Rx, with severe bladder outlet obstruction symptoms with IPSS=21, failing Rapaflo.   Plan Bladder outlet obstruction, Increased urinary frequency, Nocturia, Prostate cancer, Weak urinary stream  1. Cysto; Status:Hold For - Appointment,Date of Service; Requested for:07Oct2015;  2. URODYNAMICS; Status:Hold For - Appointment,Date of Service; Requested  for:07Oct2015;  3. Follow-up After Test Office  Follow-up: after urodynamcis for cysto  Status: Hold For -  Appointment,Date of Service  Requested for: 07Oct2015  1, Urodynamics and Myrbetriq trial.Continue Rapaflo.   Signatures Electronically signed by : Carolan Clines, M.D.; Dec 13 2013 10:37AM EST

## 2014-01-30 ENCOUNTER — Encounter (HOSPITAL_BASED_OUTPATIENT_CLINIC_OR_DEPARTMENT_OTHER): Payer: Self-pay | Admitting: Urology

## 2015-06-24 DIAGNOSIS — L259 Unspecified contact dermatitis, unspecified cause: Secondary | ICD-10-CM | POA: Diagnosis not present

## 2015-07-22 DIAGNOSIS — M5136 Other intervertebral disc degeneration, lumbar region: Secondary | ICD-10-CM | POA: Diagnosis not present

## 2015-07-22 DIAGNOSIS — G8929 Other chronic pain: Secondary | ICD-10-CM | POA: Diagnosis not present

## 2015-07-22 DIAGNOSIS — M5442 Lumbago with sciatica, left side: Secondary | ICD-10-CM | POA: Diagnosis not present

## 2015-07-30 DIAGNOSIS — M5136 Other intervertebral disc degeneration, lumbar region: Secondary | ICD-10-CM | POA: Diagnosis not present

## 2015-08-13 DIAGNOSIS — M5136 Other intervertebral disc degeneration, lumbar region: Secondary | ICD-10-CM | POA: Diagnosis not present

## 2015-09-17 DIAGNOSIS — N320 Bladder-neck obstruction: Secondary | ICD-10-CM | POA: Diagnosis not present

## 2015-09-24 DIAGNOSIS — N5201 Erectile dysfunction due to arterial insufficiency: Secondary | ICD-10-CM | POA: Diagnosis not present

## 2015-09-24 DIAGNOSIS — C61 Malignant neoplasm of prostate: Secondary | ICD-10-CM | POA: Diagnosis not present

## 2015-11-25 DIAGNOSIS — Z1211 Encounter for screening for malignant neoplasm of colon: Secondary | ICD-10-CM | POA: Diagnosis not present

## 2015-11-25 DIAGNOSIS — I10 Essential (primary) hypertension: Secondary | ICD-10-CM | POA: Diagnosis not present

## 2015-11-25 DIAGNOSIS — Z23 Encounter for immunization: Secondary | ICD-10-CM | POA: Diagnosis not present

## 2015-11-25 DIAGNOSIS — E782 Mixed hyperlipidemia: Secondary | ICD-10-CM | POA: Diagnosis not present

## 2015-11-25 DIAGNOSIS — L719 Rosacea, unspecified: Secondary | ICD-10-CM | POA: Diagnosis not present

## 2015-11-25 DIAGNOSIS — Z0000 Encounter for general adult medical examination without abnormal findings: Secondary | ICD-10-CM | POA: Diagnosis not present

## 2015-11-25 DIAGNOSIS — K219 Gastro-esophageal reflux disease without esophagitis: Secondary | ICD-10-CM | POA: Diagnosis not present

## 2015-11-25 DIAGNOSIS — N529 Male erectile dysfunction, unspecified: Secondary | ICD-10-CM | POA: Diagnosis not present

## 2015-11-25 DIAGNOSIS — C61 Malignant neoplasm of prostate: Secondary | ICD-10-CM | POA: Diagnosis not present

## 2015-12-06 DIAGNOSIS — Z1211 Encounter for screening for malignant neoplasm of colon: Secondary | ICD-10-CM | POA: Diagnosis not present

## 2016-12-14 DIAGNOSIS — Z0000 Encounter for general adult medical examination without abnormal findings: Secondary | ICD-10-CM | POA: Diagnosis not present

## 2016-12-14 DIAGNOSIS — E782 Mixed hyperlipidemia: Secondary | ICD-10-CM | POA: Diagnosis not present

## 2016-12-14 DIAGNOSIS — C61 Malignant neoplasm of prostate: Secondary | ICD-10-CM | POA: Diagnosis not present

## 2016-12-14 DIAGNOSIS — Z1159 Encounter for screening for other viral diseases: Secondary | ICD-10-CM | POA: Diagnosis not present

## 2016-12-14 DIAGNOSIS — I10 Essential (primary) hypertension: Secondary | ICD-10-CM | POA: Diagnosis not present

## 2017-02-09 DIAGNOSIS — L718 Other rosacea: Secondary | ICD-10-CM | POA: Diagnosis not present

## 2018-01-05 ENCOUNTER — Other Ambulatory Visit: Payer: Self-pay | Admitting: Family Medicine

## 2018-01-05 ENCOUNTER — Ambulatory Visit
Admission: RE | Admit: 2018-01-05 | Discharge: 2018-01-05 | Disposition: A | Discharge status: Home / Self Care | Payer: Medicare Other | Source: Ambulatory Visit | Attending: Family Medicine | Admitting: Family Medicine

## 2018-01-05 DIAGNOSIS — R059 Cough, unspecified: Secondary | ICD-10-CM

## 2018-01-05 DIAGNOSIS — E782 Mixed hyperlipidemia: Secondary | ICD-10-CM | POA: Diagnosis not present

## 2018-01-05 DIAGNOSIS — I10 Essential (primary) hypertension: Secondary | ICD-10-CM | POA: Diagnosis not present

## 2018-01-05 DIAGNOSIS — Z0000 Encounter for general adult medical examination without abnormal findings: Secondary | ICD-10-CM | POA: Diagnosis not present

## 2018-01-05 DIAGNOSIS — R05 Cough: Secondary | ICD-10-CM | POA: Diagnosis not present

## 2018-01-05 DIAGNOSIS — Z8546 Personal history of malignant neoplasm of prostate: Secondary | ICD-10-CM | POA: Diagnosis not present

## 2018-01-12 DIAGNOSIS — M720 Palmar fascial fibromatosis [Dupuytren]: Secondary | ICD-10-CM | POA: Diagnosis not present

## 2018-03-14 DIAGNOSIS — M720 Palmar fascial fibromatosis [Dupuytren]: Secondary | ICD-10-CM | POA: Diagnosis not present

## 2018-03-16 DIAGNOSIS — M25641 Stiffness of right hand, not elsewhere classified: Secondary | ICD-10-CM | POA: Diagnosis not present

## 2018-03-16 DIAGNOSIS — M720 Palmar fascial fibromatosis [Dupuytren]: Secondary | ICD-10-CM | POA: Diagnosis not present

## 2018-03-30 DIAGNOSIS — Z5189 Encounter for other specified aftercare: Secondary | ICD-10-CM | POA: Diagnosis not present

## 2018-03-30 DIAGNOSIS — M720 Palmar fascial fibromatosis [Dupuytren]: Secondary | ICD-10-CM | POA: Diagnosis not present

## 2018-12-06 DIAGNOSIS — Z23 Encounter for immunization: Secondary | ICD-10-CM | POA: Diagnosis not present

## 2018-12-14 DIAGNOSIS — L711 Rhinophyma: Secondary | ICD-10-CM | POA: Diagnosis not present

## 2018-12-14 DIAGNOSIS — Z79899 Other long term (current) drug therapy: Secondary | ICD-10-CM | POA: Diagnosis not present

## 2018-12-28 DIAGNOSIS — L711 Rhinophyma: Secondary | ICD-10-CM | POA: Diagnosis not present

## 2019-01-11 DIAGNOSIS — I498 Other specified cardiac arrhythmias: Secondary | ICD-10-CM | POA: Diagnosis not present

## 2019-01-11 DIAGNOSIS — L711 Rhinophyma: Secondary | ICD-10-CM | POA: Diagnosis not present

## 2019-01-13 DIAGNOSIS — Z01812 Encounter for preprocedural laboratory examination: Secondary | ICD-10-CM | POA: Diagnosis not present

## 2019-01-13 DIAGNOSIS — H26499 Other secondary cataract, unspecified eye: Secondary | ICD-10-CM | POA: Diagnosis not present

## 2019-01-13 DIAGNOSIS — Z20828 Contact with and (suspected) exposure to other viral communicable diseases: Secondary | ICD-10-CM | POA: Diagnosis not present

## 2019-01-13 DIAGNOSIS — H2510 Age-related nuclear cataract, unspecified eye: Secondary | ICD-10-CM | POA: Diagnosis not present

## 2019-01-20 DIAGNOSIS — K219 Gastro-esophageal reflux disease without esophagitis: Secondary | ICD-10-CM | POA: Diagnosis not present

## 2019-01-20 DIAGNOSIS — Z79899 Other long term (current) drug therapy: Secondary | ICD-10-CM | POA: Diagnosis not present

## 2019-01-20 DIAGNOSIS — E785 Hyperlipidemia, unspecified: Secondary | ICD-10-CM | POA: Diagnosis not present

## 2019-01-20 DIAGNOSIS — F1010 Alcohol abuse, uncomplicated: Secondary | ICD-10-CM | POA: Diagnosis not present

## 2019-01-20 DIAGNOSIS — I10 Essential (primary) hypertension: Secondary | ICD-10-CM | POA: Diagnosis not present

## 2019-01-20 DIAGNOSIS — L711 Rhinophyma: Secondary | ICD-10-CM | POA: Diagnosis not present

## 2019-01-25 DIAGNOSIS — E782 Mixed hyperlipidemia: Secondary | ICD-10-CM | POA: Diagnosis not present

## 2019-01-25 DIAGNOSIS — Z0000 Encounter for general adult medical examination without abnormal findings: Secondary | ICD-10-CM | POA: Diagnosis not present

## 2019-01-25 DIAGNOSIS — C61 Malignant neoplasm of prostate: Secondary | ICD-10-CM | POA: Diagnosis not present

## 2019-01-25 DIAGNOSIS — I10 Essential (primary) hypertension: Secondary | ICD-10-CM | POA: Diagnosis not present

## 2019-03-04 DIAGNOSIS — R0789 Other chest pain: Secondary | ICD-10-CM | POA: Diagnosis not present

## 2019-04-05 ENCOUNTER — Ambulatory Visit: Payer: Medicare Other

## 2019-04-13 ENCOUNTER — Ambulatory Visit: Payer: Medicare HMO | Attending: Internal Medicine

## 2019-04-13 DIAGNOSIS — Z23 Encounter for immunization: Secondary | ICD-10-CM | POA: Insufficient documentation

## 2019-04-13 NOTE — Progress Notes (Signed)
   Covid-19 Vaccination Clinic  Name:  Frederick Miller    MRN: [id number] DOB: [birth year]  04/13/2019  [name] was observed post Covid-19 immunization for 15 minutes without incidence. He was provided with Vaccine Information Sheet and instruction to access the V-Safe system.   [name] was instructed to call 911 with any severe reactions post vaccine: Marland Kitchen Difficulty breathing  . Swelling of your face and throat  . A fast heartbeat  . A bad rash all over your body  . Dizziness and weakness    Immunizations Administered    Name Date Dose VIS Date Route   Pfizer COVID-19 Vaccine 04/13/2019  8:52 AM 0.3 mL 02/17/2019 Intramuscular   Manufacturer: [place]   Lot: [id number]   [place]: [id number]

## 2019-05-09 ENCOUNTER — Ambulatory Visit: Payer: Medicare HMO | Attending: Internal Medicine

## 2019-05-09 DIAGNOSIS — Z23 Encounter for immunization: Secondary | ICD-10-CM | POA: Insufficient documentation

## 2019-05-09 NOTE — Progress Notes (Signed)
   Covid-19 Vaccination Clinic  Name:  Frederick Miller    MRN: [id number] DOB: Oct 16, 1945  05/09/2019  [name] was observed post Covid-19 immunization for 15 minutes without incident. He was provided with Vaccine Information Sheet and instruction to access the V-Safe system.   [name] was instructed to call 911 with any severe reactions post vaccine: Marland Kitchen Difficulty breathing  . Swelling of face and throat  . A fast heartbeat  . A bad rash all over body  . Dizziness and weakness   Immunizations Administered    Name Date Dose VIS Date Route   Pfizer COVID-19 Vaccine 05/09/2019  8:36 AM 0.3 mL 02/17/2019 Intramuscular   Manufacturer: [place]   Lot: [id number]   [place]: [id number]

## 2019-05-17 DIAGNOSIS — M5416 Radiculopathy, lumbar region: Secondary | ICD-10-CM | POA: Diagnosis not present

## 2019-05-17 DIAGNOSIS — M5136 Other intervertebral disc degeneration, lumbar region: Secondary | ICD-10-CM | POA: Diagnosis not present

## 2019-05-25 DIAGNOSIS — M5416 Radiculopathy, lumbar region: Secondary | ICD-10-CM | POA: Diagnosis not present

## 2020-01-17 ENCOUNTER — Ambulatory Visit: Payer: Medicare Other | Attending: Internal Medicine

## 2020-01-17 ENCOUNTER — Other Ambulatory Visit (HOSPITAL_COMMUNITY): Payer: Self-pay | Admitting: Internal Medicine

## 2020-01-17 DIAGNOSIS — Z23 Encounter for immunization: Secondary | ICD-10-CM

## 2020-01-17 NOTE — Progress Notes (Signed)
   Covid-19 Vaccination Clinic  Name:  Frederick Miller    MRN: [id number] DOB: 1945-07-30  01/17/2020  [name] was observed post Covid-19 immunization for 15 minutes without incident. He was provided with Vaccine Information Sheet and instruction to access the V-Safe system.   [name] was instructed to call 911 with any severe reactions post vaccine: Marland Kitchen Difficulty breathing  . Swelling of face and throat  . A fast heartbeat  . A bad rash all over body  . Dizziness and weakness

## 2020-02-21 DIAGNOSIS — I10 Essential (primary) hypertension: Secondary | ICD-10-CM | POA: Diagnosis not present

## 2020-02-21 DIAGNOSIS — E782 Mixed hyperlipidemia: Secondary | ICD-10-CM | POA: Diagnosis not present

## 2020-02-21 DIAGNOSIS — C61 Malignant neoplasm of prostate: Secondary | ICD-10-CM | POA: Diagnosis not present

## 2020-02-21 DIAGNOSIS — Z0000 Encounter for general adult medical examination without abnormal findings: Secondary | ICD-10-CM | POA: Diagnosis not present

## 2020-03-12 DIAGNOSIS — L821 Other seborrheic keratosis: Secondary | ICD-10-CM | POA: Diagnosis not present

## 2020-04-09 DIAGNOSIS — N528 Other male erectile dysfunction: Secondary | ICD-10-CM | POA: Diagnosis not present

## 2020-09-29 ENCOUNTER — Other Ambulatory Visit: Payer: Self-pay

## 2020-09-29 ENCOUNTER — Encounter (HOSPITAL_COMMUNITY): Payer: Self-pay | Admitting: *Deleted

## 2020-09-29 ENCOUNTER — Emergency Department (HOSPITAL_COMMUNITY)
Admission: EM | Admit: 2020-09-29 | Discharge: 2020-09-29 | Disposition: A | Discharge status: Home / Self Care | Payer: Medicare Other | Attending: Emergency Medicine | Admitting: Emergency Medicine

## 2020-09-29 DIAGNOSIS — Z8546 Personal history of malignant neoplasm of prostate: Secondary | ICD-10-CM | POA: Diagnosis not present

## 2020-09-29 DIAGNOSIS — W1839XA Other fall on same level, initial encounter: Secondary | ICD-10-CM | POA: Insufficient documentation

## 2020-09-29 DIAGNOSIS — S0001XA Abrasion of scalp, initial encounter: Secondary | ICD-10-CM | POA: Insufficient documentation

## 2020-09-29 DIAGNOSIS — S0990XA Unspecified injury of head, initial encounter: Secondary | ICD-10-CM | POA: Diagnosis present

## 2020-09-29 DIAGNOSIS — Z96611 Presence of right artificial shoulder joint: Secondary | ICD-10-CM | POA: Diagnosis not present

## 2020-09-29 DIAGNOSIS — Y9301 Activity, walking, marching and hiking: Secondary | ICD-10-CM | POA: Insufficient documentation

## 2020-09-29 DIAGNOSIS — Z79899 Other long term (current) drug therapy: Secondary | ICD-10-CM | POA: Insufficient documentation

## 2020-09-29 DIAGNOSIS — I10 Essential (primary) hypertension: Secondary | ICD-10-CM | POA: Insufficient documentation

## 2020-09-29 NOTE — ED Triage Notes (Addendum)
Pt reports falling while walking his dog and his dog ran the opposite direction. Pt has abrasion to top of head, no loc.

## 2020-09-29 NOTE — ED Provider Notes (Signed)
[facility] EMERGENCY DEPARTMENT Provider Note   CSN: [id number] Arrival date & time: 09/29/20  2012     History Chief Complaint  Patient presents with   Frederick Miller is a 75 y.o. male.  75 year old male presents with abrasion to his scalp.  Patient states he was walking his dog tonight and the dog took off running causing him to fall resulting in an abrasion to his scalp.  Patient denies loss of consciousness, is not anticoagulated, feels fine other than bleeding from his scalp which concerned him and caused him to present to the emergency room today.  Bleeding has since stopped, he has no further complaints or concerns.  Last tetanus less than 5 years ago but will call PCP tomorrow to confirm.      Past Medical History:  Diagnosis Date   At risk for sleep apnea    STOP-BANG= 4     SENT TO PCP 01-25-2014   Frequency of urination    GERD (gastroesophageal reflux disease)    History of prostate cancer    S/P  RADIATIVE PROSTATE SEED IMPLANTS 12-11-2010   Hyperlipidemia    Hypertension    Incomplete right bundle branch block (RBBB)    Urethral stricture    Urgency of urination     Patient Active Problem List   Diagnosis Date Noted   COUGH 09/04/2008   HYPERCHOLESTEROLEMIA 09/03/2008   HYPERTENSION 09/03/2008   ESOPHAGEAL REFLUX 09/03/2008    Past Surgical History:  Procedure Laterality Date   BALLOON DILATION N/A 01/29/2014   Procedure: COOK BALLOON DILATION OF PENDULOUS URETERAL STRICTURE;  Surgeon: Ailene Rud, MD;  [facility];  Service: Urology;  Laterality: N/A;   COLONOSCOPY     PARS PLANA VITRECTOMY W/ ENDOPHOTOCOAGULATION Right 02-22-2004   repair right eye retinal tear and recurrent vitreous hemorrhage   RADIOACTIVE PROSTATE SEED IMPLANTS  12-11-2010   RETINAL DETACHMENT SURGERY  X2  last one 2005   TOTAL SHOULDER REPLACEMENT Right 2006       Family History  Problem Relation Age of Onset    Hypertension Mother    Cancer - Prostate Father    Cancer - Prostate Brother    Cancer - Prostate Brother     Social History   Tobacco Use   Smoking status: Never   Smokeless tobacco: Never  Substance Use Topics   Alcohol use: Yes    Alcohol/week: 36.0 standard drinks    Types: 36 Cans of beer per week    Comment: 6 pk beer daily   Drug use: No    Home Medications Prior to Admission medications   Medication Sig Start Date End Date Taking? Authorizing Provider  amLODipine (NORVASC) 10 MG tablet Take 10 mg by mouth every morning.     [provider]  COVID-19 mRNA vaccine, Pfizer, 30 MCG/0.3ML injection USE AS DIRECTED 01/17/20 01/16/21  Carlyle Basques, MD  esomeprazole (NEXIUM) 40 MG capsule Take 40 mg by mouth 2 (two) times daily.     [provider]  Glucosamine 500 MG CAPS Take 1 capsule by mouth daily.     [provider]  losartan-hydrochlorothiazide (HYZAAR) 100-25 MG per tablet Take 1 tablet by mouth every morning.     [provider]  Omega-3 Fatty Acids (FISH OIL) 1000 MG CAPS Take 2 capsules by mouth daily.    [provider]  phenazopyridine (PYRIDIUM) 200 MG tablet Take 1 tablet (200 mg total) by mouth  3 (three) times daily as needed for pain. 01/29/14   Carolan Clines, MD    Allergies    Lisinopril  Review of Systems   Review of Systems  Constitutional:  Negative for fever.  Musculoskeletal:  Negative for arthralgias, back pain, gait problem, joint swelling, myalgias, neck pain and neck stiffness.  Skin:  Positive for wound.  Allergic/Immunologic: Negative for immunocompromised state.  Neurological:  Negative for dizziness, weakness and headaches.  Hematological:  Does not bruise/bleed easily.  Psychiatric/Behavioral:  Negative for confusion.    Physical Exam Updated Vital Signs BP (!) 150/86 (BP Location: Right Arm)   Pulse 75   Temp 97.8 F (36.6 C) (Oral)   Resp 18   SpO2 97%   Physical  Exam Vitals and nursing note reviewed.  Constitutional:      General: He is not in acute distress.    Appearance: He is well-developed. He is not diaphoretic.     Comments: Approximately 3cm x3cm superficial abrasion to right parietal scalp  HENT:     Head: Normocephalic.     Nose: Nose normal.     Mouth/Throat:     Mouth: Mucous membranes are moist.  Eyes:     Pupils: Pupils are equal, round, and reactive to light.  Pulmonary:     Effort: Pulmonary effort is normal.  Musculoskeletal:        General: No swelling, tenderness, deformity or signs of injury. Normal range of motion.     Cervical back: Normal range of motion and neck supple. No tenderness.     Comments: No pain with range of motion or palpation upper or lower extremities.  No midline neck or back tenderness.  Skin:    General: Skin is warm and dry.     Findings: No erythema or rash.  Neurological:     Mental Status: He is alert and oriented to person, place, and time.     Sensory: No sensory deficit.     Motor: No weakness.     Gait: Gait normal.  Psychiatric:        Behavior: Behavior normal.    ED Results / Procedures / Treatments   Labs (all labs ordered are listed, but only abnormal results are displayed) Labs Reviewed - No data to display  EKG None  Radiology No results found.  Procedures Procedures   Medications Ordered in ED Medications - No data to display  ED Course  I have reviewed the triage vital signs and the nursing notes.  Pertinent labs & imaging results that were available during my care of the patient were reviewed by me and considered in my medical decision making (see chart for details).  Clinical Course as of 09/29/20 2138  Sun Sep 30, 7263  3132 75 year old male with abrasion to right parietal scalp after fall as above.  Bleeding controlled, no other complaints or concerns and would like to be discharged. [LM]    Clinical Course User Index [LM] Roque Lias   MDM  Rules/Calculators/A&P                           Final Clinical Impression(s) / ED Diagnoses Final diagnoses:  Abrasion of scalp, initial encounter    Rx / DC Orders ED Discharge Orders     None        Roque Lias 09/29/20 2138    Isla Pence, MD 09/29/20 2312

## 2020-10-23 DIAGNOSIS — Z1211 Encounter for screening for malignant neoplasm of colon: Secondary | ICD-10-CM | POA: Diagnosis not present

## 2020-10-23 DIAGNOSIS — K219 Gastro-esophageal reflux disease without esophagitis: Secondary | ICD-10-CM | POA: Diagnosis not present

## 2020-10-23 DIAGNOSIS — R059 Cough, unspecified: Secondary | ICD-10-CM | POA: Diagnosis not present

## 2020-10-25 DIAGNOSIS — Z20822 Contact with and (suspected) exposure to covid-19: Secondary | ICD-10-CM | POA: Diagnosis not present

## 2020-11-14 DIAGNOSIS — D128 Benign neoplasm of rectum: Secondary | ICD-10-CM | POA: Diagnosis not present

## 2020-11-14 DIAGNOSIS — K222 Esophageal obstruction: Secondary | ICD-10-CM | POA: Diagnosis not present

## 2020-11-14 DIAGNOSIS — Z1211 Encounter for screening for malignant neoplasm of colon: Secondary | ICD-10-CM | POA: Diagnosis not present

## 2020-11-14 DIAGNOSIS — K5730 Diverticulosis of large intestine without perforation or abscess without bleeding: Secondary | ICD-10-CM | POA: Diagnosis not present

## 2020-11-14 DIAGNOSIS — K621 Rectal polyp: Secondary | ICD-10-CM | POA: Diagnosis not present

## 2020-11-14 DIAGNOSIS — K635 Polyp of colon: Secondary | ICD-10-CM | POA: Diagnosis not present

## 2020-11-14 DIAGNOSIS — R12 Heartburn: Secondary | ICD-10-CM | POA: Diagnosis not present

## 2020-12-16 DIAGNOSIS — R059 Cough, unspecified: Secondary | ICD-10-CM | POA: Diagnosis not present

## 2021-03-05 DIAGNOSIS — C61 Malignant neoplasm of prostate: Secondary | ICD-10-CM | POA: Diagnosis not present

## 2021-03-05 DIAGNOSIS — E782 Mixed hyperlipidemia: Secondary | ICD-10-CM | POA: Diagnosis not present

## 2021-03-05 DIAGNOSIS — I10 Essential (primary) hypertension: Secondary | ICD-10-CM | POA: Diagnosis not present

## 2021-03-05 DIAGNOSIS — Z0000 Encounter for general adult medical examination without abnormal findings: Secondary | ICD-10-CM | POA: Diagnosis not present

## 2021-03-18 DIAGNOSIS — L821 Other seborrheic keratosis: Secondary | ICD-10-CM | POA: Diagnosis not present

## 2021-03-18 DIAGNOSIS — L570 Actinic keratosis: Secondary | ICD-10-CM | POA: Diagnosis not present

## 2021-03-18 DIAGNOSIS — D1801 Hemangioma of skin and subcutaneous tissue: Secondary | ICD-10-CM | POA: Diagnosis not present

## 2021-03-18 DIAGNOSIS — L719 Rosacea, unspecified: Secondary | ICD-10-CM | POA: Diagnosis not present

## 2021-06-09 ENCOUNTER — Emergency Department (HOSPITAL_BASED_OUTPATIENT_CLINIC_OR_DEPARTMENT_OTHER): Payer: Medicare Other

## 2021-06-09 ENCOUNTER — Emergency Department (HOSPITAL_BASED_OUTPATIENT_CLINIC_OR_DEPARTMENT_OTHER)
Admission: EM | Admit: 2021-06-09 | Discharge: 2021-06-09 | Disposition: A | Discharge status: Home / Self Care | Payer: Medicare Other | Attending: Emergency Medicine | Admitting: Emergency Medicine

## 2021-06-09 ENCOUNTER — Other Ambulatory Visit: Payer: Self-pay

## 2021-06-09 DIAGNOSIS — Z4802 Encounter for removal of sutures: Secondary | ICD-10-CM | POA: Diagnosis not present

## 2021-06-09 DIAGNOSIS — S0101XA Laceration without foreign body of scalp, initial encounter: Secondary | ICD-10-CM | POA: Insufficient documentation

## 2021-06-09 DIAGNOSIS — W228XXA Striking against or struck by other objects, initial encounter: Secondary | ICD-10-CM | POA: Insufficient documentation

## 2021-06-09 DIAGNOSIS — S0990XA Unspecified injury of head, initial encounter: Secondary | ICD-10-CM | POA: Diagnosis not present

## 2021-06-09 DIAGNOSIS — Z79899 Other long term (current) drug therapy: Secondary | ICD-10-CM | POA: Insufficient documentation

## 2021-06-09 DIAGNOSIS — S0191XA Laceration without foreign body of unspecified part of head, initial encounter: Secondary | ICD-10-CM | POA: Diagnosis not present

## 2021-06-09 DIAGNOSIS — I10 Essential (primary) hypertension: Secondary | ICD-10-CM | POA: Diagnosis not present

## 2021-06-09 DIAGNOSIS — Z23 Encounter for immunization: Secondary | ICD-10-CM | POA: Insufficient documentation

## 2021-06-09 MED ORDER — TETANUS-DIPHTH-ACELL PERTUSSIS 5-2.5-18.5 LF-MCG/0.5 IM SUSY
0.5000 mL | PREFILLED_SYRINGE | Freq: Once | INTRAMUSCULAR | Status: AC
  Administered 2021-06-09: 0.5 mL via INTRAMUSCULAR
  Filled 2021-06-09: qty 0.5

## 2021-06-09 MED ORDER — LIDOCAINE-EPINEPHRINE (PF) 2 %-1:200000 IJ SOLN
20.0000 mL | Freq: Once | INTRAMUSCULAR | Status: AC
  Administered 2021-06-09: 20 mL
  Filled 2021-06-09: qty 20

## 2021-06-09 NOTE — ED Provider Notes (Signed)
?[facility] EMERGENCY DEPT ?Provider Note ? ? ?CSN: [id number] ?Arrival date & time: 06/09/21  0909 ? ?  ? ?History ?PMH: HTN, HLD ?Chief Complaint  ?Patient presents with  ? Head Laceration  ? Head Injury  ? ? ?Frederick Miller is a 76 y.o. male.  Presents to the Emergency department after having a head injury.  He says he was bending down, when he stood straight up and hit the top of his head against a bookshelf.  He has a laceration associated with this that was bleeding a lot at first, but he says this is has slowed down.  He denies any loss of consciousness. Denies hx of blood thinners. Denies any speech changes, numbness, gait abnormalities, n/v, dizziness, vision changes, weakness, otorrhea, or rhinorrhea. Denies neck pain or any other injuries. Not in any pain right now. Last tetanus shot was in the past 5-10 years. ? ? ?Head Laceration ? ?Head Injury ? ?  ? ?Home Medications ?Prior to Admission medications   ?Medication Sig Start Date End Date Taking? Authorizing Provider  ?amLODipine (NORVASC) 10 MG tablet Take 10 mg by mouth every morning.     [provider]  ?esomeprazole (NEXIUM) 40 MG capsule Take 40 mg by mouth 2 (two) times daily.     [provider]  ?Glucosamine 500 MG CAPS Take 1 capsule by mouth daily.     [provider]  ?losartan-hydrochlorothiazide (HYZAAR) 100-25 MG per tablet Take 1 tablet by mouth every morning.     [provider]  ?Omega-3 Fatty Acids (FISH OIL) 1000 MG CAPS Take 2 capsules by mouth daily.    [provider]  ?phenazopyridine (PYRIDIUM) 200 MG tablet Take 1 tablet (200 mg total) by mouth 3 (three) times daily as needed for pain. 01/29/14   Carolan Clines, MD  ?   ? ?Allergies    ?Lisinopril   ? ?Review of Systems   ?Review of Systems  ?Skin:  Positive for wound.  ?All other systems reviewed and are negative. ? ?Physical Exam ?Updated Vital Signs ?BP (!) 146/80   Pulse 65   Temp 97.8 ?F (36.6 ?C) (Oral)    Resp 16   Ht '5\' 9"'$  (1.753 m)   Wt 88.5 kg   SpO2 99%   BMI 28.80 kg/m?  ?Physical Exam ?Vitals and nursing note reviewed.  ?Constitutional:   ?   General: He is not in acute distress. ?   Appearance: Normal appearance. He is well-developed. He is not ill-appearing, toxic-appearing or diaphoretic.  ?HENT:  ?   Head: Normocephalic.  ?   Comments: 2 cm laceration to top of head. Bleeding controlled. Fairly well approximated and not gaping. I cannot visualize galea or other internal structures. ?   Ears:  ?   Comments: No battles sign or otorrhea. No hemotympanum  ?   Nose: No nasal deformity or rhinorrhea.  ?   Mouth/Throat:  ?   Lips: Pink. No lesions.  ?Eyes:  ?   General: Gaze aligned appropriately. No scleral icterus.    ?   Right eye: No discharge.     ?   Left eye: No discharge.  ?   Extraocular Movements: Extraocular movements intact.  ?   Conjunctiva/sclera: Conjunctivae normal.  ?   Right eye: Right conjunctiva is not injected. No exudate or hemorrhage. ?   Left eye: Left conjunctiva is not injected. No exudate or hemorrhage. ?   Pupils: Pupils are equal, round, and reactive to light.  ?  Comments: No periorbital edema or raccoon eyes  ?Pulmonary:  ?   Effort: Pulmonary effort is normal. No respiratory distress.  ?Abdominal:  ?   General: Abdomen is flat.  ?   Palpations: Abdomen is soft.  ?   Tenderness: There is no abdominal tenderness. There is no guarding or rebound.  ?Musculoskeletal:  ?   Right lower leg: No edema.  ?   Left lower leg: No edema.  ?Skin: ?   General: Skin is warm and dry.  ?Neurological:  ?   Mental Status: He is alert and oriented to person, place, and time.  ?   Comments: Alert and Oriented x 3 ?Speech clear with no aphasia ?Cranial Nerve testing ?- PERRLA. EOM intact. No Nystagmus ?- Facial Sensation grossly intact ?- No facial asymmetry ?- Uvula and Tongue Midline ?- Accessory Muscles intact ?Motor: ?- 5/5 motor strength in all four extremities.  ?Sensation: ?- Grossly intact in  all four extremities.  ?Coordination:  ?- Gait without abnormality. ?  ?Psychiatric:     ?   Mood and Affect: Mood normal.     ?   Speech: Speech normal.     ?   Behavior: Behavior normal. Behavior is cooperative.  ? ? ?ED Results / Procedures / Treatments   ?Labs ?(all labs ordered are listed, but only abnormal results are displayed) ?Labs Reviewed - No data to display ? ?EKG ?None ? ?Radiology ?CT Head Wo Contrast ? ?Result Date: 06/09/2021 ?CLINICAL DATA:  Head trauma EXAM: CT HEAD WITHOUT CONTRAST TECHNIQUE: Contiguous axial images were obtained from the base of the skull through the vertex without intravenous contrast. RADIATION DOSE REDUCTION: This exam was performed according to the departmental dose-optimization program which includes automated exposure control, adjustment of the mA and/or kV according to patient size and/or use of iterative reconstruction technique. COMPARISON:  None. FINDINGS: Brain: No evidence of acute infarction, hemorrhage, hydrocephalus, extra-axial collection or mass lesion/mass effect. Vascular: No hyperdense vessel or unexpected calcification. Skull: Normal. Negative for fracture or focal lesion. Sinuses/Orbits: No acute finding. Other: None. IMPRESSION: No acute intracranial abnormality. Electronically Signed   By: Yetta Glassman M.D.   On: 06/09/2021 10:07   ? ?Procedures ?Marland Kitchen.Laceration Repair ? ?Date/Time: 06/09/2021 10:59 AM ?Performed by: Adolphus Birchwood, PA-C ?Authorized by: Adolphus Birchwood, PA-C  ? ?Consent:  ?  Consent obtained:  Verbal ?  Consent given by:  Patient ?  Risks, benefits, and alternatives were discussed: yes   ?  Risks discussed:  Infection, pain, poor cosmetic result, need for additional repair, nerve damage, poor wound healing, vascular damage, retained foreign body and tendon damage ?  Alternatives discussed:  No treatment ?Universal protocol:  ?  Procedure explained and questions answered to patient or proxy's satisfaction: yes   ?  Patient identity  confirmed:  Verbally with patient ?Anesthesia:  ?  Anesthesia method:  Local infiltration ?  Local anesthetic:  Lidocaine 2% WITH epi ?Laceration details:  ?  Location:  Scalp ?  Scalp location:  Mid-scalp ?  Length (cm):  2 ?Pre-procedure details:  ?  Preparation:  Patient was prepped and draped in usual sterile fashion and imaging obtained to evaluate for foreign bodies ?Exploration:  ?  Hemostasis achieved with:  Direct pressure ?  Imaging obtained comment:  CT ?  Imaging outcome: foreign body not noted   ?  Wound exploration: entire depth of wound visualized   ?  Contaminated: no   ?Treatment:  ?  Area cleansed with:  Saline ?  Amount of cleaning:  Standard ?  Irrigation solution:  Sterile saline ?  Irrigation method:  Syringe ?  Debridement:  None ?Skin repair:  ?  Repair method:  Staples ?  Number of staples:  6 ?Approximation:  ?  Approximation:  Close ?Repair type:  ?  Repair type:  Simple ?Post-procedure details:  ?  Dressing:  Open (no dressing) ?  Procedure completion:  Tolerated well, no immediate complications  ? ?Medications Ordered in ED ?Medications  ?lidocaine-EPINEPHrine (XYLOCAINE W/EPI) 2 %-1:200000 (PF) injection 20 mL (20 mLs Infiltration Given 06/09/21 0959)  ?Tdap (BOOSTRIX) injection 0.5 mL (0.5 mLs Intramuscular Given 06/09/21 0959)  ? ? ?ED Course/ Medical Decision Making/ A&P ?Clinical Course as of 06/09/21 1102  ?Mon Jun 09, 2021  ?1045 6 staples [GL]  ?  ?Clinical Course User Index ?[GL] Aidyn Kellis, Adora Fridge, PA-C  ? ?                        ?Medical Decision Making ?Amount and/or Complexity of Data Reviewed ?Radiology: ordered. ? ?Risk ?Prescription drug management. ? ? ? ?MDM  ?This is a 76 y.o. male who presents to the ED with laceration to top of head after head injury ?The differential of this patient includes but is not limited to [place], SAH, SDH, concussion, skull fracture, simple laceration. ? ?My Impression, Plan, and ED Course: Patient appears well. Vitals stable. Only complaint is  laceration on head. Laceration is with bleeding well controlled. Seems fairly well approximated. Does not extend to galea or skull. He does not have any neurological complaints or findings on exam that are concerning.

## 2021-06-09 NOTE — Discharge Instructions (Signed)
Please return in 7-10 days for staple removal. This can be done at any medical facility.  ? ?Please return here earlier if you notice signs of infection such as redness, abnormal drainage, or fevers. ?

## 2021-06-09 NOTE — ED Notes (Signed)
Patient transported to CT 

## 2021-06-09 NOTE — ED Triage Notes (Signed)
Pt was at the store, bent down to pick up something, when pt stood up , hit head on metal rack, top of head; aprox 1.25 inch laceration noted, bleeding currently controlled.pt not on blood thinners, denies LOC. Denies pain.  ?

## 2021-06-09 NOTE — ED Notes (Signed)
Head wound cleansed.   ?

## 2021-06-16 ENCOUNTER — Encounter (HOSPITAL_BASED_OUTPATIENT_CLINIC_OR_DEPARTMENT_OTHER): Payer: Self-pay | Admitting: Obstetrics and Gynecology

## 2021-06-16 ENCOUNTER — Other Ambulatory Visit: Payer: Self-pay

## 2021-06-16 ENCOUNTER — Emergency Department (HOSPITAL_BASED_OUTPATIENT_CLINIC_OR_DEPARTMENT_OTHER)
Admission: EM | Admit: 2021-06-16 | Discharge: 2021-06-16 | Disposition: A | Discharge status: Home / Self Care | Payer: Medicare Other | Attending: Emergency Medicine | Admitting: Emergency Medicine

## 2021-06-16 DIAGNOSIS — Z79899 Other long term (current) drug therapy: Secondary | ICD-10-CM | POA: Insufficient documentation

## 2021-06-16 DIAGNOSIS — Z4802 Encounter for removal of sutures: Secondary | ICD-10-CM | POA: Diagnosis not present

## 2021-06-16 DIAGNOSIS — S0101XD Laceration without foreign body of scalp, subsequent encounter: Secondary | ICD-10-CM | POA: Diagnosis not present

## 2021-06-16 DIAGNOSIS — I10 Essential (primary) hypertension: Secondary | ICD-10-CM | POA: Insufficient documentation

## 2021-06-16 NOTE — ED Provider Notes (Signed)
?[facility] EM[facility] DEPT ?Provider Note ? ? ?CSN: [id number] ?Arrival date & time: 06/16/21  1206 ? ?  ? ?History ? ?Chief Complaint  ?Patient presents with  ? Suture / Staple Removal  ? ? ?Frederick Miller is a 76 y.o. male. ? ? Patient as above with significant medical history as below, including hyperlipidemia, hypertension, recent head injury who presents to the ED with complaint of staple removal. ? ?Patient to the ER today for staple removal, were placed initially on 4/3 ?6 staples total. ?No headaches, denies any fevers, chills, nausea or vomiting.  No difficulty with ambulation.  No chest pain or dyspnea.  Denies any drainage or purulence from the scalp wound.  No acute complaints today other than desire to have staples removed. ? ? ? ? ? ?Past Medical History: ?No date: At risk for sleep apnea ?    Comment:  STOP-BANG= 4     SENT TO PCP 01-25-2014 ?No date: Frequency of urination ?No date: GERD (gastroesophageal reflux disease) ?No date: History of prostate cancer ?    Comment:  S/P  RADIATIVE PROSTATE SEED IMPLANTS 12-11-2010 ?No date: Hyperlipidemia ?No date: Hypertension ?No date: Incomplete right bundle branch block (RBBB) ?No date: Urethral stricture ?No date: Urgency of urination ? ?Past Surgical History: ?01/29/2014: BALLOON DILATION; N/A ?    Comment:  Procedure: COOK BALLOON DILATION OF PENDULOUS URETERAL  ?             STRICTURE;  Surgeon: Ailene Rud, MD;  Location: ?             [facility];  Service: Urology;   ?             Laterality: N/A; ?No date: COLONOSCOPY ?02-22-2004: PARS PLANA VITRECTOMY W/ ENDOPHOTOCOAGULATION; Right ?    Comment:  repair right eye retinal tear and recurrent vitreous  ?             hemorrhage ?12-11-2010: RADIOACTIVE PROSTATE SEED IMPLANTS ?X2  last one 2005: RETINAL DETACHMENT SURGERY ?2006: TOTAL SHOULDER REPLACEMENT; Right  ? ? ?The history is provided by the patient. No language interpreter was used.  ?Suture / Staple  Removal ?Pertinent negatives include no chest pain, no abdominal pain, no headaches and no shortness of breath.  ? ?  ? ?Home Medications ?Prior to Admission medications   ?Medication Sig Start Date End Date Taking? Authorizing Provider  ?amLODipine (NORVASC) 10 MG tablet Take 10 mg by mouth every morning.     [provider]  ?esomeprazole (NEXIUM) 40 MG capsule Take 40 mg by mouth 2 (two) times daily.     [provider]  ?Glucosamine 500 MG CAPS Take 1 capsule by mouth daily.     [provider]  ?losartan-hydrochlorothiazide (HYZAAR) 100-25 MG per tablet Take 1 tablet by mouth every morning.     [provider]  ?Omega-3 Fatty Acids (FISH OIL) 1000 MG CAPS Take 2 capsules by mouth daily.    [provider]  ?phenazopyridine (PYRIDIUM) 200 MG tablet Take 1 tablet (200 mg total) by mouth 3 (three) times daily as needed for pain. 01/29/14   Carolan Clines, MD  ?   ? ?Allergies    ?Lisinopril   ? ?Review of Systems   ?Review of Systems  ?Constitutional:  Negative for chills and fever.  ?HENT:  Negative for facial swelling and trouble swallowing.   ?Eyes:  Negative for photophobia and visual disturbance.  ?Respiratory:  Negative for cough and shortness  of breath.   ?Cardiovascular:  Negative for chest pain and palpitations.  ?Gastrointestinal:  Negative for abdominal pain, nausea and vomiting.  ?Endocrine: Negative for polydipsia and polyuria.  ?Genitourinary:  Negative for difficulty urinating and hematuria.  ?Musculoskeletal:  Negative for gait problem and joint swelling.  ?Skin:  Positive for wound. Negative for pallor and rash.  ?Neurological:  Negative for syncope and headaches.  ?Psychiatric/Behavioral:  Negative for agitation and confusion.   ? ?Physical Exam ?Updated Vital Signs ?BP (!) 146/86 (BP Location: Right Arm)   Pulse 64   Temp 97.9 ?F (36.6 ?C) (Temporal)   Resp 16   Ht '5\' 9"'$  (1.753 m)   Wt 88.5 kg   SpO2 98%   BMI 28.81 kg/m?  ?Physical  Exam ?Vitals and nursing note reviewed.  ?Constitutional:   ?   General: He is not in acute distress. ?   Appearance: He is well-developed.  ?HENT:  ?   Head: Normocephalic.  ? ?   Right Ear: External ear normal.  ?   Left Ear: External ear normal.  ?   Mouth/Throat:  ?   Mouth: Mucous membranes are moist.  ?Eyes:  ?   General: No scleral icterus. ?Cardiovascular:  ?   Rate and Rhythm: Normal rate and regular rhythm.  ?   Pulses: Normal pulses.  ?   Heart sounds: Normal heart sounds.  ?Pulmonary:  ?   Effort: Pulmonary effort is normal. No respiratory distress.  ?   Breath sounds: Normal breath sounds.  ?Abdominal:  ?   General: Abdomen is flat.  ?   Palpations: Abdomen is soft.  ?   Tenderness: There is no abdominal tenderness.  ?Musculoskeletal:     ?   General: Normal range of motion.  ?   Cervical back: Normal range of motion.  ?   Right lower leg: No edema.  ?   Left lower leg: No edema.  ?Skin: ?   General: Skin is warm and dry.  ?   Capillary Refill: Capillary refill takes less than 2 seconds.  ?Neurological:  ?   Mental Status: He is alert and oriented to person, place, and time.  ?   GCS: GCS eye subscore is 4. GCS verbal subscore is 5. GCS motor subscore is 6.  ?Psychiatric:     ?   Mood and Affect: Mood normal.     ?   Behavior: Behavior normal.  ? ? ?ED Results / Procedures / Treatments   ?Labs ?(all labs ordered are listed, but only abnormal results are displayed) ?Labs Reviewed - No data to display ? ?EKG ?None ? ?Radiology ?No results found. ? ?Procedures ?Marland KitchenSuture Removal ? ?Date/Time: 06/17/2021 5:42 PM ?Performed by: Jeanell Sparrow, DO ?Authorized by: Jeanell Sparrow, DO  ? ?Consent:  ?  Consent obtained:  Verbal ?  Consent given by:  Patient ?  Risks, benefits, and alternatives were discussed: yes   ?  Risks discussed:  Bleeding, pain and wound separation ?  Alternatives discussed:  Delayed treatment ?Universal protocol:  ?  Procedure explained and questions answered to patient or proxy's  satisfaction: yes   ?  Immediately prior to procedure, a time out was called: yes   ?  Patient identity confirmed:  Verbally with patient ?Location:  ?  Location:  Head/neck ?  Head/neck location:  Scalp ?Procedure details:  ?  Wound appearance:  No signs of infection, good wound healing and clean ?  Number of staples removed:  6 ?  Post-procedure details:  ?  Post-removal:  Antibiotic ointment applied ?  Procedure completion:  Tolerated well, no immediate complications  ? ? ?Medications Ordered in ED ?Medications - No data to display ? ?ED Course/ Medical Decision Making/ A&P ?  ?                        ?Medical Decision Making ? ?Initial Impression and Ddx ?This patient presents to the Emergency Department for the above complaint. This involves an extensive number of treatment options and is a complaint that carries with it a high risk of complications and morbidity. Vital signs were reviewed.  ? ?Serious etiologies considered.  ? ?Social determinants of health include   ?Social Determinants of Health with Concerns  ? ?Financial Resource Strain: Not on file  ?Food Insecurity: Not on file  ?Transportation Needs: Not on file  ?Physical Activity: Not on file  ?Stress: Not on file  ?Social Connections: Not on file  ?Intimate Partner Violence: Not on file  ?Depression (PHQ2-9): Not on file  ?Alcohol Screen: Not on file  ?Housing: Not on file  ?  ?Social Connections: Not on file  ?   ? ?Previous records obtained and reviewed  ? ?Interpretation of Diagnostics ?Labs & imaging results that were available during my care of the patient were visualized by me and considered in my medical decision making. ?  ?PDMP reviewed  ? ? ?Patient Reassessment and Ultimate Disposition/Management ? ?  ? ?Staples were removed.  No evidence of local infection to the scalp. Nontender to palpation. ? ?Discussed strict return precautions and ongoing wound care for the patient. ? ?The patient improved significantly and was discharged in stable  condition. Detailed discussions were had with the patient regarding current findings, and need for close f/u with PCP or on call doctor. The patient has been instructed to return immediately if the symptoms worsen in any way for re-ev

## 2021-06-16 NOTE — ED Triage Notes (Signed)
Patinet reports to the ER for staple removal. Patient has 6 staples in the top of his head.  ?

## 2021-06-16 NOTE — ED Notes (Signed)
RN provided AVS using Teachback Method. Patient verbalizes understanding of Discharge Instructions. Opportunity for Questioning and Answers were provided by RN. Patient Discharged from ED ambulatory to Home via Self.  

## 2021-06-16 NOTE — Discharge Instructions (Signed)
It was a pleasure caring for you today in the emergency department. ° °Please return to the emergency department for any worsening or worrisome symptoms. ° ° °

## 2021-08-12 DIAGNOSIS — R059 Cough, unspecified: Secondary | ICD-10-CM | POA: Diagnosis not present

## 2021-08-12 DIAGNOSIS — I10 Essential (primary) hypertension: Secondary | ICD-10-CM | POA: Diagnosis not present

## 2021-08-12 DIAGNOSIS — J069 Acute upper respiratory infection, unspecified: Secondary | ICD-10-CM | POA: Diagnosis not present

## 2021-10-15 ENCOUNTER — Other Ambulatory Visit (HOSPITAL_COMMUNITY): Payer: Self-pay

## 2022-03-26 DIAGNOSIS — C61 Malignant neoplasm of prostate: Secondary | ICD-10-CM | POA: Diagnosis not present

## 2022-03-26 DIAGNOSIS — Z0000 Encounter for general adult medical examination without abnormal findings: Secondary | ICD-10-CM | POA: Diagnosis not present

## 2022-03-26 DIAGNOSIS — Z23 Encounter for immunization: Secondary | ICD-10-CM | POA: Diagnosis not present

## 2022-03-26 DIAGNOSIS — I10 Essential (primary) hypertension: Secondary | ICD-10-CM | POA: Diagnosis not present

## 2022-03-26 DIAGNOSIS — E782 Mixed hyperlipidemia: Secondary | ICD-10-CM | POA: Diagnosis not present

## 2022-03-26 DIAGNOSIS — R7301 Impaired fasting glucose: Secondary | ICD-10-CM | POA: Diagnosis not present

## 2022-03-26 DIAGNOSIS — K219 Gastro-esophageal reflux disease without esophagitis: Secondary | ICD-10-CM | POA: Diagnosis not present

## 2022-03-26 DIAGNOSIS — L719 Rosacea, unspecified: Secondary | ICD-10-CM | POA: Diagnosis not present

## 2022-10-20 DIAGNOSIS — L570 Actinic keratosis: Secondary | ICD-10-CM | POA: Diagnosis not present

## 2022-10-20 DIAGNOSIS — L711 Rhinophyma: Secondary | ICD-10-CM | POA: Diagnosis not present

## 2022-10-20 DIAGNOSIS — L738 Other specified follicular disorders: Secondary | ICD-10-CM | POA: Diagnosis not present

## 2022-10-20 DIAGNOSIS — L821 Other seborrheic keratosis: Secondary | ICD-10-CM | POA: Diagnosis not present

## 2022-10-20 DIAGNOSIS — D1801 Hemangioma of skin and subcutaneous tissue: Secondary | ICD-10-CM | POA: Diagnosis not present

## 2023-03-31 DIAGNOSIS — I10 Essential (primary) hypertension: Secondary | ICD-10-CM | POA: Diagnosis not present

## 2023-03-31 DIAGNOSIS — K219 Gastro-esophageal reflux disease without esophagitis: Secondary | ICD-10-CM | POA: Diagnosis not present

## 2023-03-31 DIAGNOSIS — R7301 Impaired fasting glucose: Secondary | ICD-10-CM | POA: Diagnosis not present

## 2023-03-31 DIAGNOSIS — C61 Malignant neoplasm of prostate: Secondary | ICD-10-CM | POA: Diagnosis not present

## 2023-03-31 DIAGNOSIS — Z0000 Encounter for general adult medical examination without abnormal findings: Secondary | ICD-10-CM | POA: Diagnosis not present

## 2023-03-31 DIAGNOSIS — E782 Mixed hyperlipidemia: Secondary | ICD-10-CM | POA: Diagnosis not present

## 2023-11-02 DIAGNOSIS — L821 Other seborrheic keratosis: Secondary | ICD-10-CM | POA: Diagnosis not present

## 2023-11-02 DIAGNOSIS — L738 Other specified follicular disorders: Secondary | ICD-10-CM | POA: Diagnosis not present

## 2023-11-02 DIAGNOSIS — D1801 Hemangioma of skin and subcutaneous tissue: Secondary | ICD-10-CM | POA: Diagnosis not present

## 2023-11-02 DIAGNOSIS — L711 Rhinophyma: Secondary | ICD-10-CM | POA: Diagnosis not present
